# Patient Record
Sex: Male | Born: 1980 | Race: Black or African American | Hispanic: No | Marital: Single | Smoking: Current every day smoker
Health system: Southern US, Community
[De-identification: ages and names within clinical notes are randomized; demographics above are authoritative.]

## PROBLEM LIST (undated history)

## (undated) DIAGNOSIS — J45909 Unspecified asthma, uncomplicated: Secondary | ICD-10-CM

---
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
## 1998-11-24 ENCOUNTER — Emergency Department (HOSPITAL_COMMUNITY): Admission: EM | Admit: 1998-11-24 | Discharge: 1998-11-24 | Payer: Self-pay | Admitting: Emergency Medicine

## 1998-11-25 ENCOUNTER — Emergency Department (HOSPITAL_COMMUNITY): Admission: EM | Admit: 1998-11-25 | Discharge: 1998-11-25 | Payer: Self-pay | Admitting: Emergency Medicine

## 1998-11-27 ENCOUNTER — Emergency Department (HOSPITAL_COMMUNITY): Admission: EM | Admit: 1998-11-27 | Discharge: 1998-11-27 | Payer: Self-pay | Admitting: Emergency Medicine

## 1998-12-02 ENCOUNTER — Emergency Department (HOSPITAL_COMMUNITY): Admission: EM | Admit: 1998-12-02 | Discharge: 1998-12-02 | Payer: Self-pay | Admitting: Emergency Medicine

## 2003-05-18 ENCOUNTER — Emergency Department (HOSPITAL_COMMUNITY): Admission: EM | Admit: 2003-05-18 | Discharge: 2003-05-18 | Payer: Self-pay | Admitting: Emergency Medicine

## 2004-12-28 ENCOUNTER — Encounter: Admission: RE | Admit: 2004-12-28 | Discharge: 2004-12-28 | Payer: Self-pay | Admitting: Occupational Medicine

## 2005-01-19 ENCOUNTER — Ambulatory Visit (HOSPITAL_BASED_OUTPATIENT_CLINIC_OR_DEPARTMENT_OTHER): Admission: RE | Admit: 2005-01-19 | Discharge: 2005-01-19 | Payer: Self-pay | Admitting: Orthopedic Surgery

## 2005-01-19 ENCOUNTER — Ambulatory Visit (HOSPITAL_COMMUNITY): Admission: RE | Admit: 2005-01-19 | Discharge: 2005-01-19 | Payer: Self-pay | Admitting: Orthopedic Surgery

## 2007-03-21 HISTORY — PX: HAND SURGERY: SHX662

## 2009-01-15 ENCOUNTER — Emergency Department (HOSPITAL_COMMUNITY): Admission: EM | Admit: 2009-01-15 | Discharge: 2009-01-15 | Payer: Self-pay | Admitting: Family Medicine

## 2010-08-05 NOTE — Op Note (Signed)
Timothy Soto, Timothy Soto             ACCOUNT NO.:  [id number]   MEDICAL RECORD NO.:  [id number]          PATIENT TYPE:  AMB   LOCATION:  DSC                          FACILITY:  [facility]   PHYSICIAN:  Cindee Salt, M.D.       DATE OF BIRTH:  1980-04-19   DATE OF PROCEDURE:  DATE OF DISCHARGE:                                 OPERATIVE REPORT   PREOPERATIVE DIAGNOSIS:  Burn, right hand, dorsal aspect.   POSTOPERATIVE DIAGNOSIS:  Burn, right hand, dorsal aspect.   OPERATION:  Excision eschar with full-thickness skin graft from upper arm to  dorsum right hand x2.   SURGEON:  Cindee Salt, M.D.   ASSISTANT:  Carolyne Fiscal R.N.   ANESTHESIA:  General.   HISTORY:  The patient is a 30 year old male who suffered a crush injury with  a friction burn to the dorsal aspect of his right hand. He has two areas of  complete loss with significant eschar formation. He is admitted, now, for  debridement and full-thickness skin grafting.   DESCRIPTION OF PROCEDURE:  The patient is brought to the operating room.  General anesthetic carried out without difficulty.  He was prepped using  DuraPrep, supine position, right arm free. A sterile tourniquet was used.  The eschars were removed; bleeders electrocauterized. A tourniquet was then  placed and inflated after exsanguinating the limb with an Esmarch bandage.  The tourniquet was inflated to 250 mmHg.  Templates were made for the two  areas of debrided eschar. These measured approximately 1.5 x 2 cm in each  direction each.  Templates were made. A full-thickness skin graft was then  harvested from the inner upper arm. The area was undermined and closed with  interrupted 5-0 nylon sutures.   The templates had been marked onto the skin graft. These were each shaped to  the area of the defect and placed suturing them into position with a running  6-0 chromic suture. A stent dressing was placed on each. A sterile  compressive dressing and splint was applied to the  hand; a Tegaderm to the  upper arm. The patient tolerated the procedure well; and was taken to the  recovery observation in satisfactory condition. He is discharged home to  return to the [facility] [place] in 1 week on Vicodin.           ______________________________  Cindee Salt, M.D.     GK/MEDQ  D:  01/19/2005  T:  01/19/2005  Job:  [id number]

## 2010-09-01 ENCOUNTER — Emergency Department (HOSPITAL_COMMUNITY): Payer: Self-pay

## 2010-09-01 ENCOUNTER — Emergency Department (HOSPITAL_COMMUNITY)
Admission: EM | Admit: 2010-09-01 | Discharge: 2010-09-01 | Disposition: A | Discharge status: Home / Self Care | Payer: Self-pay | Attending: Emergency Medicine | Admitting: Emergency Medicine

## 2010-09-01 DIAGNOSIS — R0609 Other forms of dyspnea: Secondary | ICD-10-CM | POA: Insufficient documentation

## 2010-09-01 DIAGNOSIS — R093 Abnormal sputum: Secondary | ICD-10-CM | POA: Insufficient documentation

## 2010-09-01 DIAGNOSIS — J3489 Other specified disorders of nose and nasal sinuses: Secondary | ICD-10-CM | POA: Insufficient documentation

## 2010-09-01 DIAGNOSIS — R0602 Shortness of breath: Secondary | ICD-10-CM | POA: Insufficient documentation

## 2010-09-01 DIAGNOSIS — R05 Cough: Secondary | ICD-10-CM | POA: Insufficient documentation

## 2010-09-01 DIAGNOSIS — R059 Cough, unspecified: Secondary | ICD-10-CM | POA: Insufficient documentation

## 2010-09-01 DIAGNOSIS — F17200 Nicotine dependence, unspecified, uncomplicated: Secondary | ICD-10-CM | POA: Insufficient documentation

## 2010-09-01 DIAGNOSIS — J45909 Unspecified asthma, uncomplicated: Secondary | ICD-10-CM | POA: Insufficient documentation

## 2010-09-01 DIAGNOSIS — R0989 Other specified symptoms and signs involving the circulatory and respiratory systems: Secondary | ICD-10-CM | POA: Insufficient documentation

## 2010-09-01 DIAGNOSIS — J40 Bronchitis, not specified as acute or chronic: Secondary | ICD-10-CM | POA: Insufficient documentation

## 2015-08-04 ENCOUNTER — Emergency Department (HOSPITAL_COMMUNITY)
Admission: EM | Admit: 2015-08-04 | Discharge: 2015-08-04 | Disposition: A | Discharge status: Home / Self Care | Payer: Self-pay | Attending: Emergency Medicine | Admitting: Emergency Medicine

## 2015-08-04 ENCOUNTER — Encounter (HOSPITAL_COMMUNITY): Payer: Self-pay | Admitting: Emergency Medicine

## 2015-08-04 DIAGNOSIS — F17210 Nicotine dependence, cigarettes, uncomplicated: Secondary | ICD-10-CM | POA: Insufficient documentation

## 2015-08-04 DIAGNOSIS — M25511 Pain in right shoulder: Secondary | ICD-10-CM | POA: Insufficient documentation

## 2015-08-04 DIAGNOSIS — M542 Cervicalgia: Secondary | ICD-10-CM | POA: Insufficient documentation

## 2015-08-04 MED ORDER — NAPROXEN 250 MG PO TABS
500.0000 mg | ORAL_TABLET | Freq: Once | ORAL | Status: AC
  Administered 2015-08-04: 500 mg via ORAL
  Filled 2015-08-04: qty 2

## 2015-08-04 MED ORDER — METHOCARBAMOL 500 MG PO TABS: 500.0000 mg | ORAL_TABLET | Freq: Two times a day (BID) | ORAL | Status: DC

## 2015-08-04 MED ORDER — NAPROXEN 500 MG PO TABS: 500.0000 mg | ORAL_TABLET | Freq: Two times a day (BID) | ORAL | Status: DC

## 2015-08-04 MED ORDER — METHOCARBAMOL 500 MG PO TABS
500.0000 mg | ORAL_TABLET | Freq: Once | ORAL | Status: AC
  Administered 2015-08-04: 500 mg via ORAL
  Filled 2015-08-04: qty 1

## 2015-08-04 NOTE — ED Provider Notes (Signed)
CSN: [id number]     Arrival date & time 08/04/15  [id number] History   First MD Initiated Contact with Patient 08/04/15 0719     Chief Complaint  Patient presents with  . Neck Pain  . Shoulder Pain    HPI   Timothy Soto is an 35 y.o. male with no significant PMH who presents to the ED for evaluation of right neck and shoulder pain. He states the pain started about three weeks ago and felt like a "crook" in his neck. He states the pain has lingered and now feels like a burning pain down his right trapezius. Denies injury or trauma. Denies fever, chills, numbness, weakness, tingling. He has tried tylenol at home with no relief.   History reviewed. No pertinent past medical history. Past Surgical History  Procedure Laterality Date  . Hand surgery  2009   History reviewed. No pertinent family history. Social History  Substance Use Topics  . Smoking status: Current Every Day Smoker -- 0.50 packs/day    Types: Cigarettes  . Smokeless tobacco: Never Used  . Alcohol Use: Yes     Comment: occasionally    Review of Systems  All other systems reviewed and are negative.     Allergies  Review of patient's allergies indicates not on file.  Home Medications   Prior to Admission medications   Not on File   BP 135/96 mmHg  Pulse 81  Temp(Src) 98.4 F (36.9 C) (Oral)  Resp 18  SpO2 95% Physical Exam  Constitutional: He is oriented to person, place, and time. No distress.  HENT:  Head: Atraumatic.  Right Ear: External ear normal.  Left Ear: External ear normal.  Nose: Nose normal.  Eyes: Conjunctivae are normal. No scleral icterus.  Neck: Normal range of motion. Neck supple.  Cardiovascular: Normal rate and regular rhythm.   Pulmonary/Chest: Effort normal. No respiratory distress. He exhibits no tenderness.  Abdominal: Soft. He exhibits no distension. There is no tenderness.  Musculoskeletal:  Right trapezial tenderness and spasm. No midline back tenderness. No stepoff or  deformity. FROM of neck. 5/5 strength in bilateral upper extremities.   Neurological: He is alert and oriented to person, place, and time.  Skin: Skin is warm and dry. He is not diaphoretic.  Psychiatric: He has a normal mood and affect. His behavior is normal.  Nursing note and vitals reviewed.   ED Course  Procedures (including critical care time) Labs Review Labs Reviewed - No data to display  Imaging Review No results found. I have personally reviewed and evaluated these images and lab results as part of my medical decision-making.   EKG Interpretation None      MDM   Final diagnoses:  Right shoulder pain    Suspect msk/soft tissue strain/spasm. No midline back tenderness. No neuro deficits. Rx given for NSAIDs and muscle relaxants. Encouraged ROM exercises, warm compresses. Instructed ortho f/u if symptoms persist. ER return precautions given.     Carlene CoriaSerena Y Leona Alen, PA-C 08/04/15 0732  Tilden FossaElizabeth Rees, MD 08/08/15 [phone]

## 2015-08-04 NOTE — Discharge Instructions (Signed)
Shoulder Pain The shoulder is the joint that connects your arms to your body. The bones that form the shoulder joint include the upper arm bone (humerus), the shoulder blade (scapula), and the collarbone (clavicle). The top of the humerus is shaped like a ball and fits into a rather flat socket on the scapula (glenoid cavity). A combination of muscles and strong, fibrous tissues that connect muscles to bones (tendons) support your shoulder joint and hold the ball in the socket. Small, fluid-filled sacs (bursae) are located in different areas of the joint. They act as cushions between the bones and the overlying soft tissues and help reduce friction between the gliding tendons and the bone as you move your arm. Your shoulder joint allows a wide range of motion in your arm. This range of motion allows you to do things like scratch your back or throw a ball. However, this range of motion also makes your shoulder more prone to pain from overuse and injury. Causes of shoulder pain can originate from both injury and overuse and usually can be grouped in the following four categories:  Redness, swelling, and pain (inflammation) of the tendon (tendinitis) or the bursae (bursitis).  Instability, such as a dislocation of the joint.  Inflammation of the joint (arthritis).  Broken bone (fracture). HOME CARE INSTRUCTIONS   Apply ice to the sore area.  Put ice in a plastic bag.  Place a towel between your skin and the bag.  Leave the ice on for 15-20 minutes, 3-4 times per day for the first 2 days, or as directed by your health care provider.  Stop using cold packs if they do not help with the pain.  If you have a shoulder sling or immobilizer, wear it as long as your caregiver instructs. Only remove it to shower or bathe. Move your arm as little as possible, but keep your hand moving to prevent swelling.  Squeeze a soft ball or foam pad as much as possible to help prevent swelling.  Only take  over-the-counter or prescription medicines for pain, discomfort, or fever as directed by your caregiver. SEEK MEDICAL CARE IF:   Your shoulder pain increases, or new pain develops in your arm, hand, or fingers.  Your hand or fingers become cold and numb.  Your pain is not relieved with medicines. SEEK IMMEDIATE MEDICAL CARE IF:   Your arm, hand, or fingers are numb or tingling.  Your arm, hand, or fingers are significantly swollen or turn white or blue. MAKE SURE YOU:   Understand these instructions.  Will watch your condition.  Will get help right away if you are not doing well or get worse.   This information is not intended to replace advice given to you by your health care provider. Make sure you discuss any questions you have with your health care provider.   Document Released: 12/14/2004 Document Revised: 03/27/2014 Document Reviewed: 06/29/2014 Elsevier Interactive Patient Education 2016 Elsevier Inc.  Musculoskeletal Pain Musculoskeletal pain is muscle and boney aches and pains. These pains can occur in any part of the body. Your caregiver may treat you without knowing the cause of the pain. They may treat you if blood or urine tests, X-rays, and other tests were normal.  CAUSES There is often not a definite cause or reason for these pains. These pains may be caused by a type of germ (virus). The discomfort may also come from overuse. Overuse includes working out too hard when your body is not fit. Boney aches  also come from weather changes. Bone is sensitive to atmospheric pressure changes. HOME CARE INSTRUCTIONS   Ask when your test results will be ready. Make sure you get your test results.  Only take over-the-counter or prescription medicines for pain, discomfort, or fever as directed by your caregiver. If you were given medications for your condition, do not drive, operate machinery or power tools, or sign legal documents for 24 hours. Do not drink alcohol. Do not take  sleeping pills or other medications that may interfere with treatment.  Continue all activities unless the activities cause more pain. When the pain lessens, slowly resume normal activities. Gradually increase the intensity and duration of the activities or exercise.  During periods of severe pain, bed rest may be helpful. Lay or sit in any position that is comfortable.  Putting ice on the injured area.  Put ice in a bag.  Place a towel between your skin and the bag.  Leave the ice on for 15 to 20 minutes, 3 to 4 times a day.  Follow up with your caregiver for continued problems and no reason can be found for the pain. If the pain becomes worse or does not go away, it may be necessary to repeat tests or do additional testing. Your caregiver may need to look further for a possible cause. SEEK IMMEDIATE MEDICAL CARE IF:  You have pain that is getting worse and is not relieved by medications.  You develop chest pain that is associated with shortness or breath, sweating, feeling sick to your stomach (nauseous), or throw up (vomit).  Your pain becomes localized to the abdomen.  You develop any new symptoms that seem different or that concern you. MAKE SURE YOU:   Understand these instructions.  Will watch your condition.  Will get help right away if you are not doing well or get worse.   This information is not intended to replace advice given to you by your health care provider. Make sure you discuss any questions you have with your health care provider.   Document Released: 03/06/2005 Document Revised: 05/29/2011 Document Reviewed: 11/08/2012 Elsevier Interactive Patient Education [employer] Elsevier Inc.

## 2015-08-04 NOTE — ED Notes (Signed)
Pt to ER with complaint of right neck and shoulder pain onset x3 weeks prior to this visit. Pt denies injury to neck and shoulder. Reports waking up 3 weeks ago and feeling as if he had a "crook" in his neck. The pain has worsened. Reports it is worse with movement. Denies any other symptoms. Pt a/o x4. NAD. VSS.

## 2016-07-26 ENCOUNTER — Emergency Department (HOSPITAL_COMMUNITY)
Admission: EM | Admit: 2016-07-26 | Discharge: 2016-07-26 | Disposition: A | Discharge status: Home / Self Care | Payer: Self-pay | Attending: Emergency Medicine | Admitting: Emergency Medicine

## 2016-07-26 DIAGNOSIS — F17210 Nicotine dependence, cigarettes, uncomplicated: Secondary | ICD-10-CM | POA: Insufficient documentation

## 2016-07-26 DIAGNOSIS — L2389 Allergic contact dermatitis due to other agents: Secondary | ICD-10-CM | POA: Insufficient documentation

## 2016-07-26 MED ORDER — DIPHENHYDRAMINE-ZINC ACETATE 1-0.1 % EX CREA: TOPICAL_CREAM | Freq: Three times a day (TID) | CUTANEOUS | 0 refills | Status: DC | PRN

## 2016-07-26 NOTE — ED Triage Notes (Signed)
Pt reports being bite by something yesterday while at work. Pt has a red raised bump on left forehead.

## 2016-07-26 NOTE — Discharge Instructions (Signed)
Please read instructions below. You can apply topical Benadryl cream as needed for itching. Avoid scratching and touching your rash, as this can cause a bacterial infection. Monitor for signs of infection including pain, redness and pus draining from rash. Return to the ER for fever, signs of infection, or new or worsening symptoms.

## 2016-07-26 NOTE — ED Provider Notes (Signed)
[facility]-EMERGENCY DEPT Provider Note   CSN: [id number] Arrival date & time: 07/26/16  1255  By signing my name below, I, Marnette Burgessyan Andrew Long, attest that this documentation has been prepared under the direction and in the presence of [place] N. Russo, PA-C. Electronically Signed: Marnette Burgessyan Andrew Long, Scribe. 07/26/2016. 1:59 PM.  History   Chief Complaint Chief Complaint  Patient presents with  . Insect Bite   The history is provided by the patient and medical records. No language interpreter was used.    HPI Comments:  Timothy Soto is a 36 y.o. male with no pertinent PMHx, who presents to the Emergency Department complaining of an area of gradually worsening redness and associated swelling to the left forehead onset yesterday. Pt reports he thinks he was bitten by something yesterday at work or immediately after work on the left lateral forehead. He states the area is nonpainful but itchy. When he awoke this morning, there was yellow drainage from the site. He presents to the ED after experiencing associated symptoms of light-headedness, fever, and a HA during work today. No alleviating factors noted. No h/o herpes, eczema, or psoriasis. No new soaps, lotions, detergents, foods, animals, plants, or medications used. Pt denies tongue swelling, difficulty swallowing, SOB, dizziness, and any other complaints at this time.   No past medical history on file.  There are no active problems to display for this patient.  Past Surgical History:  Procedure Laterality Date  . HAND SURGERY  2009    Home Medications    Prior to Admission medications   Medication Sig Start Date End Date Taking? Authorizing Provider  methocarbamol (ROBAXIN) 500 MG tablet Take 1 tablet (500 mg total) by mouth 2 (two) times daily. 08/04/15   Sam, Ace GinsSerena Y, PA-C  naproxen (NAPROSYN) 500 MG tablet Take 1 tablet (500 mg total) by mouth 2 (two) times daily. 08/04/15   Carlene CoriaSam, Serena Y, PA-C   Family History No family history on  file.  Social History Social History  Substance Use Topics  . Smoking status: Current Every Day Smoker    Packs/day: 0.50    Types: Cigarettes  . Smokeless tobacco: Never Used  . Alcohol use Yes     Comment: occasionally   Allergies   Patient has no known allergies.  Review of Systems Review of Systems  HENT: Positive for facial swelling (around the "bite"). Negative for trouble swallowing.   Respiratory: Negative for shortness of breath.   Neurological: Positive for light-headedness. Negative for dizziness.    Physical Exam Updated Vital Signs BP (!) 127/94 (BP Location: Right Arm)   Pulse 96   Temp 98.5 F (36.9 C) (Oral)   Resp 18   Ht 5\' 9"  (1.753 m)   Wt 90.7 kg   SpO2 97%   BMI 29.53 kg/m   Physical Exam  Constitutional: He appears well-developed and well-nourished. No distress.  HENT:  Head: Normocephalic and atraumatic.  Eyes: Conjunctivae and EOM are normal. Pupils are equal, round, and reactive to light.  Cardiovascular: Normal rate.   Pulmonary/Chest: Effort normal.  Skin:  Left temple with 1 cm round vesicular plaque w minimal underlying edema, nontender, no purulent drainage, not fluctuant.   Psychiatric: He has a normal mood and affect. His behavior is normal.  Nursing note and vitals reviewed.  ED Treatments / Results  DIAGNOSTIC STUDIES:  Oxygen Saturation is 97% on RA, normal by my interpretation.    COORDINATION OF CARE:  1:56 PM Discussed treatment plan with pt at  bedside and pt agreed to plan.  Labs (all labs ordered are listed, but only abnormal results are displayed) Labs Reviewed - No data to display  EKG  EKG Interpretation None       Radiology No results found.  Procedures Procedures (including critical care time)  Medications Ordered in ED Medications - No data to display   Initial Impression / Assessment and Plan / ED Course  I have reviewed the triage vital signs and the nursing notes.  Pertinent labs &  imaging results that were available during my care of the patient were reviewed by me and considered in my medical decision making (see chart for details).    Patient with contact dermatitis. Instructed to avoid itching and touching rash, as well as to offending agents and to use unscented soaps, lotions, and detergents. Will treat with topical benadryl.  No signs of secondary infection. Return precautions discussed. Pt is safe for discharge at this time.  Patient discussed with Dr. Jeraldine Loots.  Discussed results, findings, treatment and follow up. Patient advised of return precautions. Patient verbalized understanding and agreed with plan.   Final Clinical Impressions(s) / ED Diagnoses   Final diagnoses:  Allergic contact dermatitis due to other agents    New Prescriptions New Prescriptions   No medications on file   I personally performed the services described in this documentation, which was scribed in my presence. The recorded information has been reviewed and is accurate.\   Russo, [place] N, PA-C 07/26/16 1412    Gerhard Munch, MD 07/30/16 [phone]

## 2016-09-03 ENCOUNTER — Emergency Department (HOSPITAL_COMMUNITY)
Admission: EM | Admit: 2016-09-03 | Discharge: 2016-09-03 | Disposition: A | Discharge status: Home / Self Care | Payer: Self-pay | Attending: Emergency Medicine | Admitting: Emergency Medicine

## 2016-09-03 ENCOUNTER — Encounter (HOSPITAL_COMMUNITY): Payer: Self-pay | Admitting: Emergency Medicine

## 2016-09-03 ENCOUNTER — Emergency Department (HOSPITAL_COMMUNITY): Payer: Self-pay

## 2016-09-03 DIAGNOSIS — Y929 Unspecified place or not applicable: Secondary | ICD-10-CM | POA: Insufficient documentation

## 2016-09-03 DIAGNOSIS — Z79899 Other long term (current) drug therapy: Secondary | ICD-10-CM | POA: Insufficient documentation

## 2016-09-03 DIAGNOSIS — S62339A Displaced fracture of neck of unspecified metacarpal bone, initial encounter for closed fracture: Secondary | ICD-10-CM

## 2016-09-03 DIAGNOSIS — Y9389 Activity, other specified: Secondary | ICD-10-CM | POA: Insufficient documentation

## 2016-09-03 DIAGNOSIS — F17210 Nicotine dependence, cigarettes, uncomplicated: Secondary | ICD-10-CM | POA: Insufficient documentation

## 2016-09-03 DIAGNOSIS — M79641 Pain in right hand: Secondary | ICD-10-CM

## 2016-09-03 DIAGNOSIS — Z791 Long term (current) use of non-steroidal anti-inflammatories (NSAID): Secondary | ICD-10-CM | POA: Insufficient documentation

## 2016-09-03 DIAGNOSIS — S62336A Displaced fracture of neck of fifth metacarpal bone, right hand, initial encounter for closed fracture: Secondary | ICD-10-CM | POA: Insufficient documentation

## 2016-09-03 DIAGNOSIS — Y999 Unspecified external cause status: Secondary | ICD-10-CM | POA: Insufficient documentation

## 2016-09-03 NOTE — ED Triage Notes (Signed)
Pt to ER for eval of right hand swelling and pain after injury Friday night. VSS. Swelling noted to right hand.

## 2016-09-03 NOTE — Progress Notes (Signed)
Orthopedic Tech Progress Note Patient Details:  Timothy Soto [birth year] [id number]  Ortho Devices Type of Ortho Device: Arm sling, Ulna gutter splint Ortho Device/Splint Location: rue Ortho Device/Splint Interventions: Ordered, Application, Adjustment   Timothy Soto, Timothy Soto 09/03/2016, 8:01 PM

## 2016-09-03 NOTE — Discharge Instructions (Signed)
Please take Ibuprofen (Advil, motrin) and Tylenol (acetaminophen) to relieve your pain.  You may take up to 800 MG (4 pills) of normal strength ibuprofen every 8 hours as needed.  In between doses of ibuprofen you make take tylenol, up to 1,000 mg (two extra strength pills).  Do not take more than 3,000 mg tylenol in a 24 hour period.  Please check all medication labels as many medications such as pain and cold medications may contain tylenol.  Do not drink alcohol while taking these medications.  Do not take other NSAID'S while taking ibuprofen (such as aleve or naproxen). Please take ibuprofen with food to decrease stomach upset.  Your prescription pain medication contains Tylenol. Please only take the prescription pain medication if your pain is unable to be controlled with ibuprofen and Tylenol.
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
## 2017-07-16 ENCOUNTER — Encounter (HOSPITAL_COMMUNITY): Payer: Self-pay | Admitting: Emergency Medicine

## 2017-07-16 ENCOUNTER — Emergency Department (HOSPITAL_COMMUNITY): Payer: Self-pay

## 2017-07-16 ENCOUNTER — Emergency Department (HOSPITAL_COMMUNITY)
Admission: EM | Admit: 2017-07-16 | Discharge: 2017-07-16 | Disposition: A | Discharge status: Home / Self Care | Payer: Self-pay | Attending: Emergency Medicine | Admitting: Emergency Medicine

## 2017-07-16 DIAGNOSIS — Y999 Unspecified external cause status: Secondary | ICD-10-CM | POA: Insufficient documentation

## 2017-07-16 DIAGNOSIS — S86911A Strain of unspecified muscle(s) and tendon(s) at lower leg level, right leg, initial encounter: Secondary | ICD-10-CM | POA: Insufficient documentation

## 2017-07-16 DIAGNOSIS — Z79899 Other long term (current) drug therapy: Secondary | ICD-10-CM | POA: Insufficient documentation

## 2017-07-16 DIAGNOSIS — X501XXA Overexertion from prolonged static or awkward postures, initial encounter: Secondary | ICD-10-CM | POA: Insufficient documentation

## 2017-07-16 DIAGNOSIS — S86111A Strain of other muscle(s) and tendon(s) of posterior muscle group at lower leg level, right leg, initial encounter: Secondary | ICD-10-CM

## 2017-07-16 DIAGNOSIS — Y9367 Activity, basketball: Secondary | ICD-10-CM | POA: Insufficient documentation

## 2017-07-16 DIAGNOSIS — F17210 Nicotine dependence, cigarettes, uncomplicated: Secondary | ICD-10-CM | POA: Insufficient documentation

## 2017-07-16 DIAGNOSIS — Y929 Unspecified place or not applicable: Secondary | ICD-10-CM | POA: Insufficient documentation

## 2017-07-16 MED ORDER — IBUPROFEN 400 MG PO TABS
600.0000 mg | ORAL_TABLET | Freq: Once | ORAL | Status: AC
Start: 2017-07-16 — End: 2017-07-16
  Administered 2017-07-16: 600 mg via ORAL
  Filled 2017-07-16: qty 1

## 2017-07-16 NOTE — ED Triage Notes (Signed)
Pt reports playing basketball yesterday, landed wrong and heard something pop in right calf, has had pain since then.

## 2017-07-16 NOTE — ED Notes (Signed)
Patient transported to X-ray 

## 2017-07-16 NOTE — ED Provider Notes (Signed)
[facility] EMERGENCY DEPARTMENT Provider Note   CSN: [id number] Arrival date & time: 07/16/17  4098     History   Chief Complaint Chief Complaint  Patient presents with  . Leg Pain    HPI Timothy Soto is a 37 y.o. male.  HPI   Timothy Soto is a 37 year old male with no significant past medical history who presents to the emergency department for evaluation of right calf pain.  Patient reports that he was playing basketball yesterday evening with his son.  He went in for a lab and when he landed he felt a popping sensation in his right calf.  Reports pain ever since.  States that pain is sharp and located in the mid right calf.  Pain is only present with walking.  It is particularly worsened with ankle plantarflexion.  Reports he has no pain at rest.  He has not taken any over-the-counter medications for his symptoms.  Denies break in skin, ecchymosis, swelling in the leg, knee pain, ankle pain, numbness, weakness, fever, chills.  Is able to ambulate independently, although painful.  History reviewed. No pertinent past medical history.  There are no active problems to display for this patient.   Past Surgical History:  Procedure Laterality Date  . HAND SURGERY  2009        Home Medications    Prior to Admission medications   Medication Sig Start Date End Date Taking? Authorizing Provider  acetaminophen (TYLENOL) 500 MG tablet Take 1,000 mg by mouth every 6 (six) hours as needed for moderate pain.   Yes [provider]  diphenhydrAMINE-zinc acetate (BENADRYL) cream Apply topically 3 (three) times daily as needed for itching. Patient not taking: Reported on 07/16/2017 07/26/16   Robinson, [place] N, PA-C  methocarbamol (ROBAXIN) 500 MG tablet Take 1 tablet (500 mg total) by mouth 2 (two) times daily. Patient not taking: Reported on 07/16/2017 08/04/15   Sam, Ace Gins, PA-C  naproxen (NAPROSYN) 500 MG tablet Take 1 tablet (500 mg total) by mouth 2  (two) times daily. Patient not taking: Reported on 07/16/2017 08/04/15   Carlene Coria, PA-C    Family History No family history on file.  Social History Social History   Tobacco Use  . Smoking status: Current Every Day Smoker    Packs/day: 0.50    Types: Cigarettes  . Smokeless tobacco: Never Used  Substance Use Topics  . Alcohol use: Yes    Comment: occasionally  . Drug use: Yes    Types: Marijuana    Comment: occasionally     Allergies   Patient has no known allergies.   Review of Systems Review of Systems  Constitutional: Negative for chills and fever.  Musculoskeletal: Positive for myalgias (right calf). Negative for gait problem and joint swelling.  Skin: Negative for color change, rash and wound.  Neurological: Negative for weakness and numbness.     Physical Exam Updated Vital Signs BP 96/70   Pulse 74   Temp 98.5 F (36.9 C) (Oral)   Resp 20   SpO2 99%   Physical Exam  Constitutional: He is oriented to person, place, and time. He appears well-developed and well-nourished. No distress.  HENT:  Head: Normocephalic and atraumatic.  Eyes: Right eye exhibits no discharge. Left eye exhibits no discharge.  Pulmonary/Chest: Effort normal. No respiratory distress.  Musculoskeletal:       Legs: Tender to palpation over right calf as depicted in image. No overlying rash, bruising or break in  skin. No appreciable swelling. No tenderness over the knee or ankle. Full ROM of knee joint and ankle joint. Achilles intact. DP pulses 2+ bilaterally. Distal sensation to light touch intact in bilateral LE.   Neurological: He is alert and oriented to person, place, and time. Coordination normal.  Skin: Skin is warm and dry. Capillary refill takes less than 2 seconds. He is not diaphoretic.  Psychiatric: He has a normal mood and affect. His behavior is normal.  Nursing note and vitals reviewed.    ED Treatments / Results  Labs (all labs ordered are listed, but only  abnormal results are displayed) Labs Reviewed - No data to display  EKG None  Radiology Dg Tibia/fibula Right  Result Date: 07/16/2017 CLINICAL DATA:  Pain and swelling.  Injury while playing basketball EXAM: RIGHT TIBIA AND FIBULA - 2 VIEW COMPARISON:  None. FINDINGS: Frontal and lateral views were obtained. There is no fracture or dislocation. Joint spaces appear normal. No knee or ankle joint effusion. There is a small inferior calcaneal spur. IMPRESSION: No fracture or dislocation. Joint spaces appear unremarkable. Small inferior calcaneal spur. Electronically Signed   By: Bretta Bang III M.D.   On: 07/16/2017 13:03    Procedures Procedures (including critical care time)  Medications Ordered in ED Medications  ibuprofen (ADVIL,MOTRIN) tablet 600 mg (600 mg Oral Given 07/16/17 1315)     Initial Impression / Assessment and Plan / ED Course  I have reviewed the triage vital signs and the nursing notes.  Pertinent labs & imaging results that were available during my care of the patient were reviewed by me and considered in my medical decision making (see chart for details).    X-ray right tib/fib without acute abnormality.  RLE neurovascularly intact.   Presentation consistent with gastrocnemius strain. Have counseled him on NSAID use and RICE protocol. Discussed reasons to return to the Emergency Department and he voices understanding.   Final Clinical Impressions(s) / ED Diagnoses   Final diagnoses:  Strain of right gastrocnemius muscle, initial encounter    ED Discharge Orders    None       Lawrence Marseilles 07/16/17 1343    Tegeler, Canary Brim, MD 07/16/17 [phone]

## 2017-07-16 NOTE — Discharge Instructions (Addendum)
X-ray reassuring.  No broken bones.  Please take 600 mg ibuprofen every 6 hours for pain.  Continue to ice at least twice a day for 15 minutes at a time.  Please elevate the leg when you can.  Return to the ER if you have any new or worsening symptoms like numbness or you notice worsening swelling.

## 2018-02-17 ENCOUNTER — Emergency Department (HOSPITAL_COMMUNITY)
Admission: EM | Admit: 2018-02-17 | Discharge: 2018-02-17 | Disposition: A | Discharge status: Home / Self Care | Payer: Self-pay | Attending: Emergency Medicine | Admitting: Emergency Medicine

## 2018-02-17 ENCOUNTER — Emergency Department (HOSPITAL_COMMUNITY): Payer: Self-pay

## 2018-02-17 ENCOUNTER — Encounter (HOSPITAL_COMMUNITY): Payer: Self-pay | Admitting: Emergency Medicine

## 2018-02-17 DIAGNOSIS — F1290 Cannabis use, unspecified, uncomplicated: Secondary | ICD-10-CM | POA: Insufficient documentation

## 2018-02-17 DIAGNOSIS — L309 Dermatitis, unspecified: Secondary | ICD-10-CM | POA: Insufficient documentation

## 2018-02-17 DIAGNOSIS — F17210 Nicotine dependence, cigarettes, uncomplicated: Secondary | ICD-10-CM | POA: Insufficient documentation

## 2018-02-17 DIAGNOSIS — L03011 Cellulitis of right finger: Secondary | ICD-10-CM | POA: Insufficient documentation

## 2018-02-17 MED ORDER — LIDOCAINE HCL 2 % IJ SOLN
20.0000 mL | Freq: Once | INTRAMUSCULAR | Status: AC
  Administered 2018-02-17: 400 mg via INTRADERMAL
  Filled 2018-02-17: qty 20

## 2018-02-17 MED ORDER — TRIAMCINOLONE ACETONIDE 0.5 % EX OINT: 1.0000 "application " | TOPICAL_OINTMENT | Freq: Two times a day (BID) | CUTANEOUS | 0 refills | Status: DC

## 2018-02-17 NOTE — ED Triage Notes (Signed)
Pt states he slammed his right ring finger in a door last Monday. Swelling and bruising noted. Still has sensation to finger, able to move without difficulty.

## 2018-02-17 NOTE — ED Notes (Signed)
Patient able to ambulate independently  

## 2018-02-17 NOTE — ED Provider Notes (Signed)
[facility]   CSN: [id number] Arrival date & time: 02/17/18  1019     History   Chief Complaint Chief Complaint  Patient presents with  . Finger Injury    HPI Timothy Soto is a 37 y.o. male who presents with right ring finger pain. No significant PMH. He states that one week ago he slammed his finger in the car door. Since then is has progressively become more swollen. He states it was bruised looking last night and he iced it and it improved. The pain is mild-moderate. He denies any drainage from the area. 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 He also bites his nails at times.  HPI  History reviewed. No pertinent past medical history.  There are no active problems to display for this patient.   Past Surgical History:  Procedure Laterality Date  . HAND SURGERY  2009        Home Medications    Prior to Admission medications   Medication Sig Start Date End Date Taking? Authorizing Provider  acetaminophen (TYLENOL) 500 MG tablet Take 1,000 mg by mouth every 6 (six) hours as needed for moderate pain.    [provider]  diphenhydrAMINE-zinc acetate (BENADRYL) cream Apply topically 3 (three) times daily as needed for itching. Patient not taking: Reported on 07/16/2017 07/26/16   Robinson, [place] N, PA-C  methocarbamol (ROBAXIN) 500 MG tablet Take 1 tablet (500 mg total) by mouth 2 (two) times daily. Patient not taking: Reported on 07/16/2017 08/04/15   Sam, Ace GinsSerena Y, PA-C  naproxen (NAPROSYN) 500 MG tablet Take 1 tablet (500 mg total) by mouth 2 (two) times daily. Patient not taking: Reported on 07/16/2017 08/04/15   Carlene CoriaSam, Serena Y, PA-C    Family History No family history on file.  Social History Social History   Tobacco Use  . Smoking status: Current Every Day Smoker    Packs/day: 0.50    Types: Cigarettes  . Smokeless  tobacco: Never Used  Substance Use Topics  . Alcohol use: Yes    Comment: occasionally  . Drug use: Yes    Types: Marijuana    Comment: occasionally     Allergies   Shellfish allergy   Review of Systems Review of Systems  Musculoskeletal: Positive for arthralgias and joint swelling.  Neurological: Negative for weakness and numbness.     Physical Exam Updated Vital Signs BP 133/82   Pulse 77   Temp 98.2 F (36.8 C)   Resp 19   SpO2 99%   Physical Exam  Constitutional: He is oriented to person, place, and time. He appears well-developed and well-nourished. No distress.  HENT:  Head: Normocephalic and atraumatic.  Eyes: Pupils are equal, round, and reactive to light. Conjunctivae are normal. Right eye exhibits no discharge. Left eye exhibits no discharge. No scleral icterus.  Neck: Normal range of motion.  Cardiovascular: Normal rate.  Pulmonary/Chest: Effort normal. No respiratory distress.  Abdominal: He exhibits no distension.  Musculoskeletal:  Right ring finger: Swelling over distal tip of ring finger with paronychia over lateal nail fold. FROM of DIP and PIP joint. Dry, scaly, erythematous papular rash in between finger web spaces and dorsal aspect of hand  Neurological: He is alert and oriented to person, place, and time.  Skin: Skin is warm and dry.  Psychiatric: He has a normal mood and affect. His behavior  is normal.  Nursing note and vitals reviewed.    ED Treatments / Results  Labs (all labs ordered are listed, but only abnormal results are displayed) Labs Reviewed - No data to display  EKG None  Radiology Dg Finger Ring Right  Result Date: 02/17/2018 CLINICAL DATA:  Recent injury to the distal right fourth finger with swelling and bruising and pain EXAM: RIGHT RING FINGER 2+V COMPARISON:  None. FINDINGS: Soft tissue swelling in the distal right fourth finger. No fracture or dislocation. No suspicious focal osseous lesion. No significant  arthropathy. No osseous erosions. No radiopaque foreign body. IMPRESSION: Distal right fourth finger soft tissue swelling, with no fracture or malalignment. No specific radiographic findings of osteomyelitis. Electronically Signed   By: Delbert Phenix M.D.   On: 02/17/2018 11:37    Procedures .Marland KitchenIncision and Drainage Date/Time: 02/17/2018 12:17 PM Performed by: Bethel Born, PA-C Authorized by: Bethel Born, PA-C   Consent:    Consent obtained:  Verbal   Consent given by:  Patient   Risks discussed:  Bleeding, incomplete drainage, pain and damage to other organs   Alternatives discussed:  No treatment Universal protocol:    Procedure explained and questions answered to patient or proxy's satisfaction: yes     Relevant documents present and verified: yes     Test results available and properly labeled: yes     Imaging studies available: yes     Required blood products, implants, devices, and special equipment available: yes     Site/side marked: yes     Immediately prior to procedure a time out was called: yes     Patient identity confirmed:  Verbally with patient Location:    Type:  Abscess (paronychia)   Location:  Upper extremity   Upper extremity location:  Finger   Finger location:  R ring finger Pre-procedure details:    Skin preparation:  Chloraprep Anesthesia (see MAR for exact dosages):    Anesthesia method:  Nerve block   Block needle gauge:  25 G   Block anesthetic:  Lidocaine 2% w/o epi   Block technique:  Digital   Block injection procedure:  Anatomic landmarks identified, anatomic landmarks palpated, introduced needle, negative aspiration for blood and incremental injection   Block outcome:  Anesthesia achieved Procedure type:    Complexity:  Simple Procedure details:    Incision types:  Single straight   Incision depth:  Subcutaneous   Scalpel blade:  11   Wound management:  Probed and deloculated, irrigated with saline and extensive cleaning    Drainage:  Purulent   Drainage amount:  Moderate   Packing materials:  None Post-procedure details:    Patient tolerance of procedure:  Tolerated well, no immediate complications   (including critical care time)    Medications Ordered in ED Medications  lidocaine (XYLOCAINE) 2 % (with pres) injection 400 mg (has no administration in time range)     Initial Impression / Assessment and Plan / ED Course  I have reviewed the triage vital signs and the nursing notes.  Pertinent labs & imaging results that were available during my care of the patient were reviewed by me and considered in my medical decision making (see chart for details).  37 year old male presents with right ring finger swelling. He states he shut it in a car door however exam is consistent with a paronychia. Xray is negative for fracture. Digital block was performed and anesthesia was achieved. I&D was performed and purulent drainage was  expressed. The finger was cleaned and dressed and he was given wound care instructions. He is also asking for assistance with a rash on his hands. It appears consistent with dishydrotic eczema. He will be given triamcinolone cream and advised to f/u with dermatology if not improving.  Final Clinical Impressions(s) / ED Diagnoses   Final diagnoses:  Paronychia of finger of right hand  Eczema, unspecified type    ED Discharge Orders    None       Bethel Born, PA-C 02/17/18 1220    Gwyneth Sprout, MD 02/17/18 2056

## 2018-02-17 NOTE — Discharge Instructions (Signed)
For the finger: Please keep clean and dry. Return if it is not improving  For the rash on the hand: Protect your hand from chemicals but do not wear gloves for long periods of time. Use triamcinolone ointment on the hands 1-2 times a day. Follow up with a dermatologist if you are not improving

## 2018-05-01 ENCOUNTER — Emergency Department (HOSPITAL_COMMUNITY)
Admission: EM | Admit: 2018-05-01 | Discharge: 2018-05-01 | Disposition: A | Discharge status: Home / Self Care | Payer: Self-pay | Attending: Emergency Medicine | Admitting: Emergency Medicine

## 2018-05-01 ENCOUNTER — Emergency Department (HOSPITAL_COMMUNITY): Payer: Self-pay

## 2018-05-01 ENCOUNTER — Other Ambulatory Visit: Payer: Self-pay

## 2018-05-01 DIAGNOSIS — Z79899 Other long term (current) drug therapy: Secondary | ICD-10-CM | POA: Insufficient documentation

## 2018-05-01 DIAGNOSIS — R69 Illness, unspecified: Secondary | ICD-10-CM

## 2018-05-01 DIAGNOSIS — F17210 Nicotine dependence, cigarettes, uncomplicated: Secondary | ICD-10-CM | POA: Insufficient documentation

## 2018-05-01 DIAGNOSIS — J111 Influenza due to unidentified influenza virus with other respiratory manifestations: Secondary | ICD-10-CM

## 2018-05-01 DIAGNOSIS — J101 Influenza due to other identified influenza virus with other respiratory manifestations: Secondary | ICD-10-CM | POA: Insufficient documentation

## 2018-05-01 MED ORDER — NAPROXEN 500 MG PO TABS: 500.0000 mg | ORAL_TABLET | Freq: Two times a day (BID) | ORAL | 0 refills | Status: AC

## 2018-05-01 MED ORDER — BENZONATATE 100 MG PO CAPS: 100.0000 mg | ORAL_CAPSULE | Freq: Three times a day (TID) | ORAL | 0 refills | Status: DC | PRN

## 2018-05-01 MED ORDER — ALBUTEROL SULFATE HFA 108 (90 BASE) MCG/ACT IN AERS
1.0000 | INHALATION_SPRAY | Freq: Four times a day (QID) | RESPIRATORY_TRACT | Status: DC | PRN
  Administered 2018-05-01: 2 via RESPIRATORY_TRACT
  Filled 2018-05-01: qty 6.7

## 2018-05-01 NOTE — ED Notes (Signed)
Pt agreed to go home and take tylenol and ibuprofen.

## 2018-05-01 NOTE — ED Notes (Signed)
Patient transported to X-ray 

## 2018-05-01 NOTE — Discharge Instructions (Signed)
Take Naprosyn and Tessalon as needed to help with cough and the body aches.  Drink plenty fluids, rest.  Follow-up with your primary care doctor if not better in a week or so

## 2018-05-01 NOTE — ED Triage Notes (Signed)
Pt here for headache, malaise, fever, and chills onset last night. Last Tylenol 0700 this morning.

## 2018-05-01 NOTE — ED Provider Notes (Signed)
[facility]   CSN: [id number] Arrival date & time: 05/01/18  1115     History   Chief Complaint Chief Complaint  Patient presents with  . Fever  . Headache    HPI Timothy Soto is a 38 y.o. male.  HPI Pt thought he had a fever last night.  He did not have a thermometer to confirm.  He developed a headache, chills, myalgias  and felt weak.  He started aspirin.  Not much of a cough.  No vomiting or diarrhea.  No sore throat.  No dysuria.  No abdominal pain.  He continues to have body aches.  He feels lousy and was supposed to work today. No past medical history on file.  There are no active problems to display for this patient.   Past Surgical History:  Procedure Laterality Date  . HAND SURGERY  2009        Home Medications    Prior to Admission medications   Medication Sig Start Date End Date Taking? Authorizing Provider  acetaminophen (TYLENOL) 500 MG tablet Take 1,000 mg by mouth every 6 (six) hours as needed for moderate pain.    [provider]  benzonatate (TESSALON) 100 MG capsule Take 1 capsule (100 mg total) by mouth 3 (three) times daily as needed for cough. 05/01/18   Linwood DibblesKnapp, Aileen Amore, MD  naproxen (NAPROSYN) 500 MG tablet Take 1 tablet (500 mg total) by mouth 2 (two) times daily for 7 days. 05/01/18 05/08/18  Linwood DibblesKnapp, Joliana Claflin, MD  triamcinolone ointment (KENALOG) 0.5 % Apply 1 application topically 2 (two) times daily. 02/17/18   Bethel BornGekas, Kelly Marie, PA-C    Family History No family history on file.  Social History Social History   Tobacco Use  . Smoking status: Current Every Day Smoker    Packs/day: 0.50    Types: Cigarettes  . Smokeless tobacco: Never Used  Substance Use Topics  . Alcohol use: Yes    Comment: occasionally  . Drug use: Yes    Types: Marijuana    Comment: occasionally     Allergies   Shellfish allergy   Review of Systems Review of Systems  All other systems reviewed and  are negative.    Physical Exam Updated Vital Signs BP (!) 130/102 (BP Location: Right Arm)   Pulse (!) 102   Temp 99.3 F (37.4 C) (Oral)   Resp 17   SpO2 100%   Physical Exam Vitals signs and nursing note reviewed.  Constitutional:      General: He is not in acute distress.    Appearance: He is well-developed.  HENT:     Head: Normocephalic and atraumatic.     Right Ear: External ear normal.     Left Ear: External ear normal.  Eyes:     General: No scleral icterus.       Right eye: No discharge.        Left eye: No discharge.     Conjunctiva/sclera: Conjunctivae normal.  Neck:     Musculoskeletal: Neck supple.     Trachea: No tracheal deviation.  Cardiovascular:     Rate and Rhythm: Normal rate and regular rhythm.  Pulmonary:     Effort: Pulmonary effort is normal. No respiratory distress.     Breath sounds: No stridor. Wheezing present. No rales.  Abdominal:     General: Bowel sounds are normal. There is no distension.     Palpations: Abdomen is soft.  Tenderness: There is no abdominal tenderness. There is no guarding or rebound.  Musculoskeletal:        General: No tenderness.  Skin:    General: Skin is warm and dry.     Findings: No rash.  Neurological:     Mental Status: He is alert.     Cranial Nerves: No cranial nerve deficit (no facial droop, extraocular movements intact, no slurred speech).     Sensory: No sensory deficit.     Motor: No abnormal muscle tone or seizure activity.     Coordination: Coordination normal.      ED Treatments / Results  Labs (all labs ordered are listed, but only abnormal results are displayed) Labs Reviewed - No data to display  EKG None  Radiology Dg Chest 2 View  Result Date: 05/01/2018 CLINICAL DATA:  Fever for 1 day.  Denies cough. EXAM: CHEST - 2 VIEW COMPARISON:  09/01/2010. FINDINGS: The heart size and mediastinal contours are within normal limits. Both lungs are clear. The visualized skeletal structures  are unremarkable. IMPRESSION: No active cardiopulmonary disease. Electronically Signed   By: Elsie StainJohn T Curnes M.D.   On: 05/01/2018 12:56    Procedures Procedures (including critical care time)  Medications Ordered in ED Medications  albuterol (PROVENTIL HFA;VENTOLIN HFA) 108 (90 Base) MCG/ACT inhaler 1-2 puff (2 puffs Inhalation Given 05/01/18 1202)     Initial Impression / Assessment and Plan / ED Course  I have reviewed the triage vital signs and the nursing notes.  Pertinent labs & imaging results that were available during my care of the patient were reviewed by me and considered in my medical decision making (see chart for details).   Chest x-ray without pneumonia.  He did have some mild wheezing on exam.  Patient was given albuterol inhaler.  Suspect symptoms are related to an influenza-like illness.  Discussed supportive care.  Follow-up if not improving in the next week.  Return as needed for worsening symptoms  Final Clinical Impressions(s) / ED Diagnoses   Final diagnoses:  Influenza-like illness    ED Discharge Orders         Ordered    naproxen (NAPROSYN) 500 MG tablet  2 times daily     05/01/18 1317    benzonatate (TESSALON) 100 MG capsule  3 times daily PRN     05/01/18 1317           Linwood DibblesKnapp, Ariannie Penaloza, MD 05/01/18 1319

## 2019-04-05 IMAGING — DX DG TIBIA/FIBULA 2V*R*
4 series · 4 of 4 positions shown · non-contrast
Comparison: None.

CLINICAL DATA: Pain and swelling.  Injury while playing basketball

EXAM:
RIGHT TIBIA AND FIBULA - 2 VIEW

[tibia ap (1 of 2)]
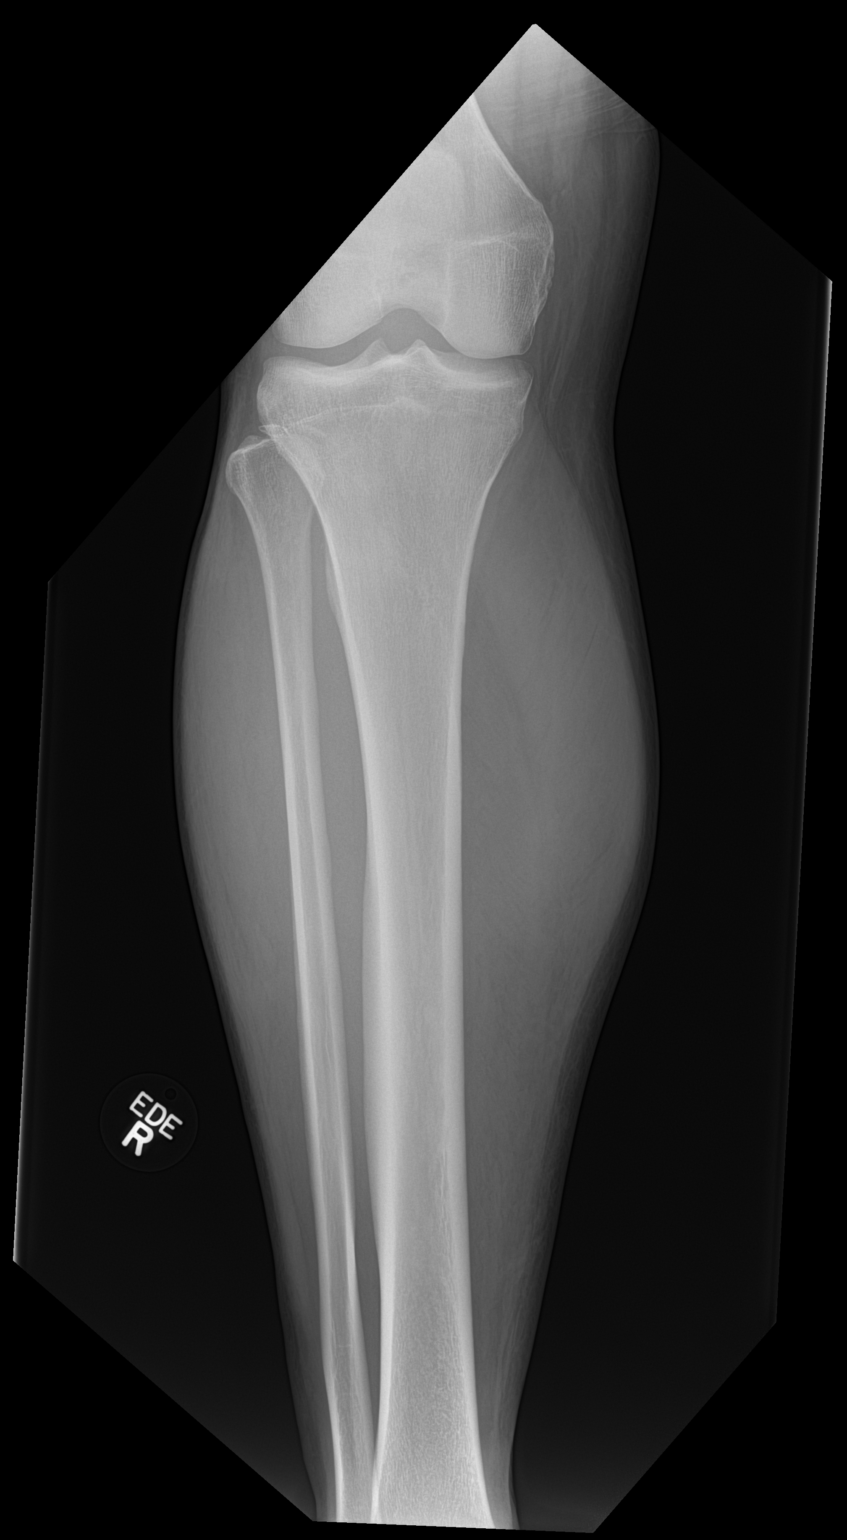

[tibia ap (2 of 2)]
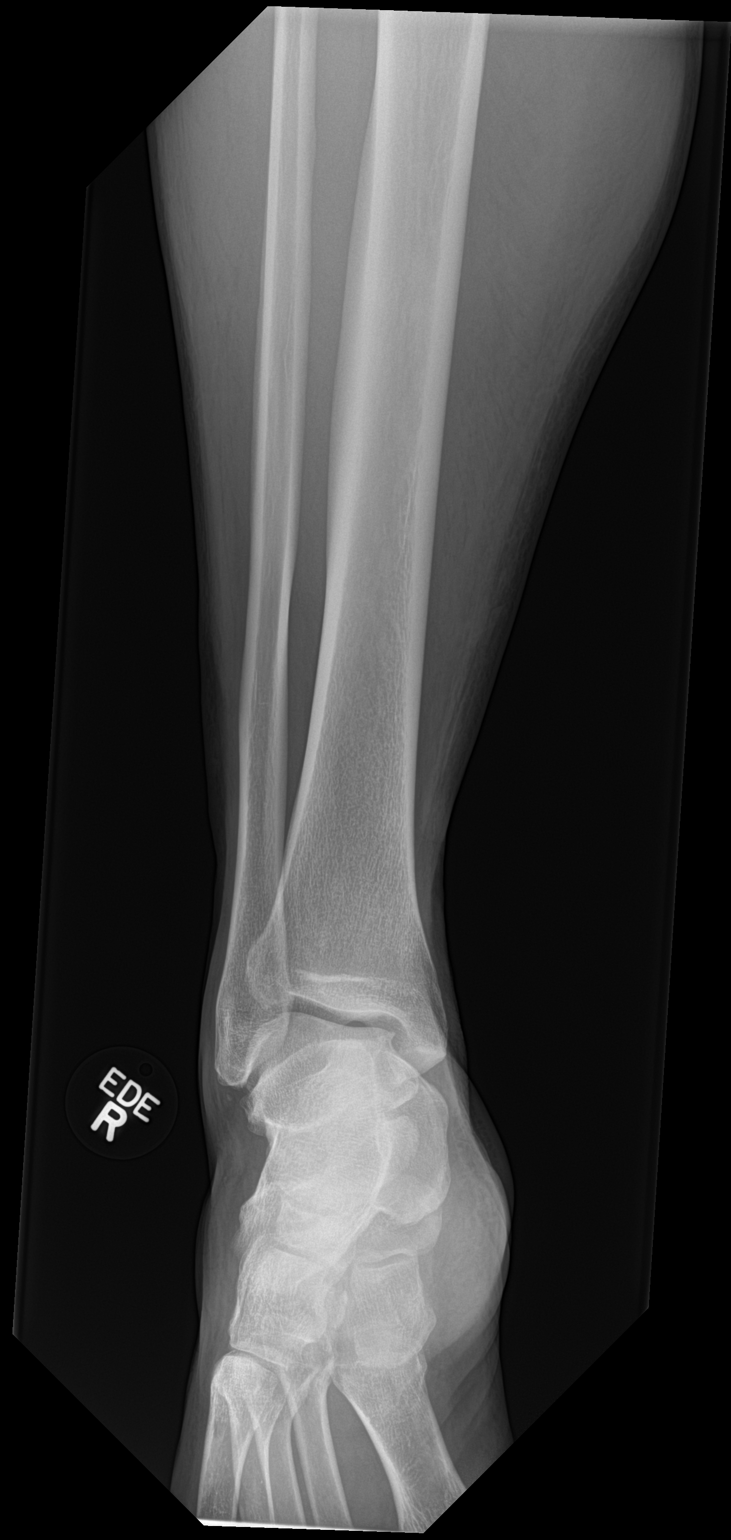

[tibia lat (1 of 2)]
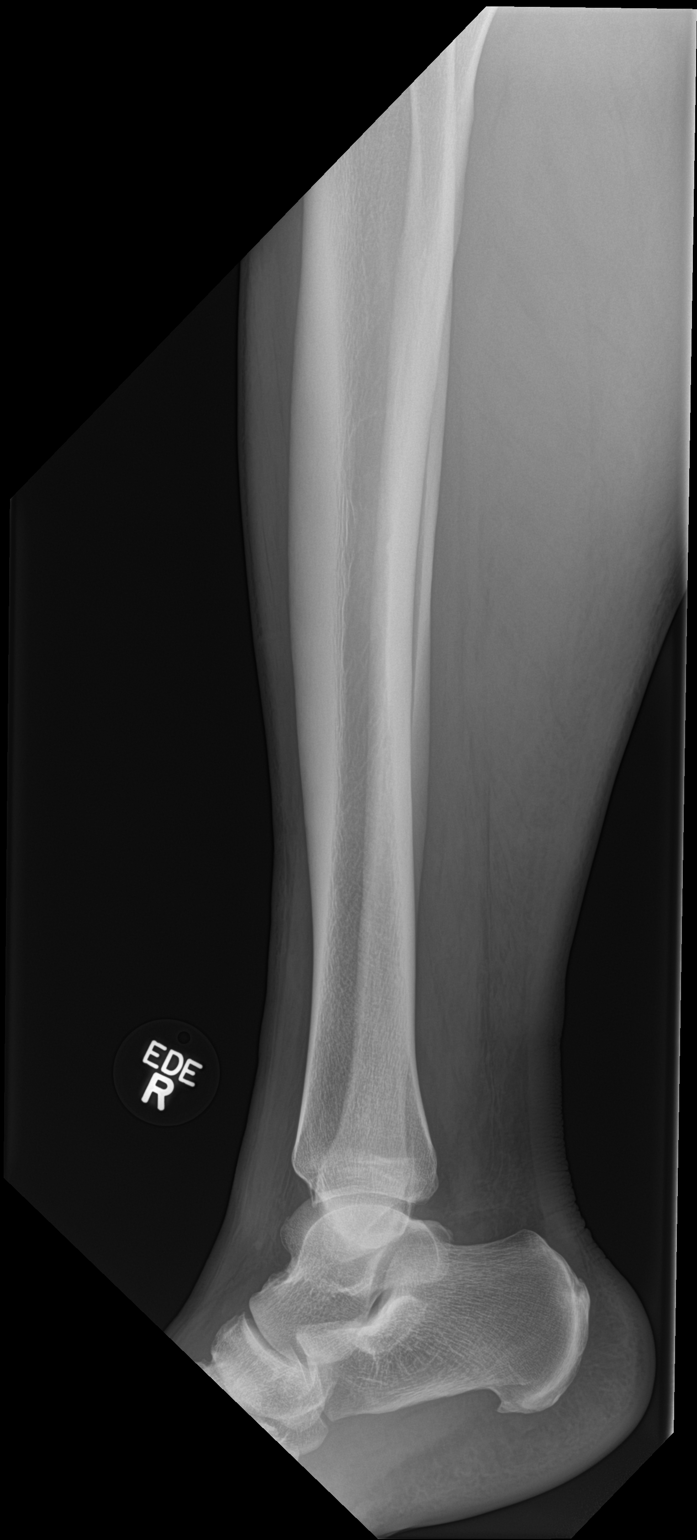

[tibia lat (2 of 2)]
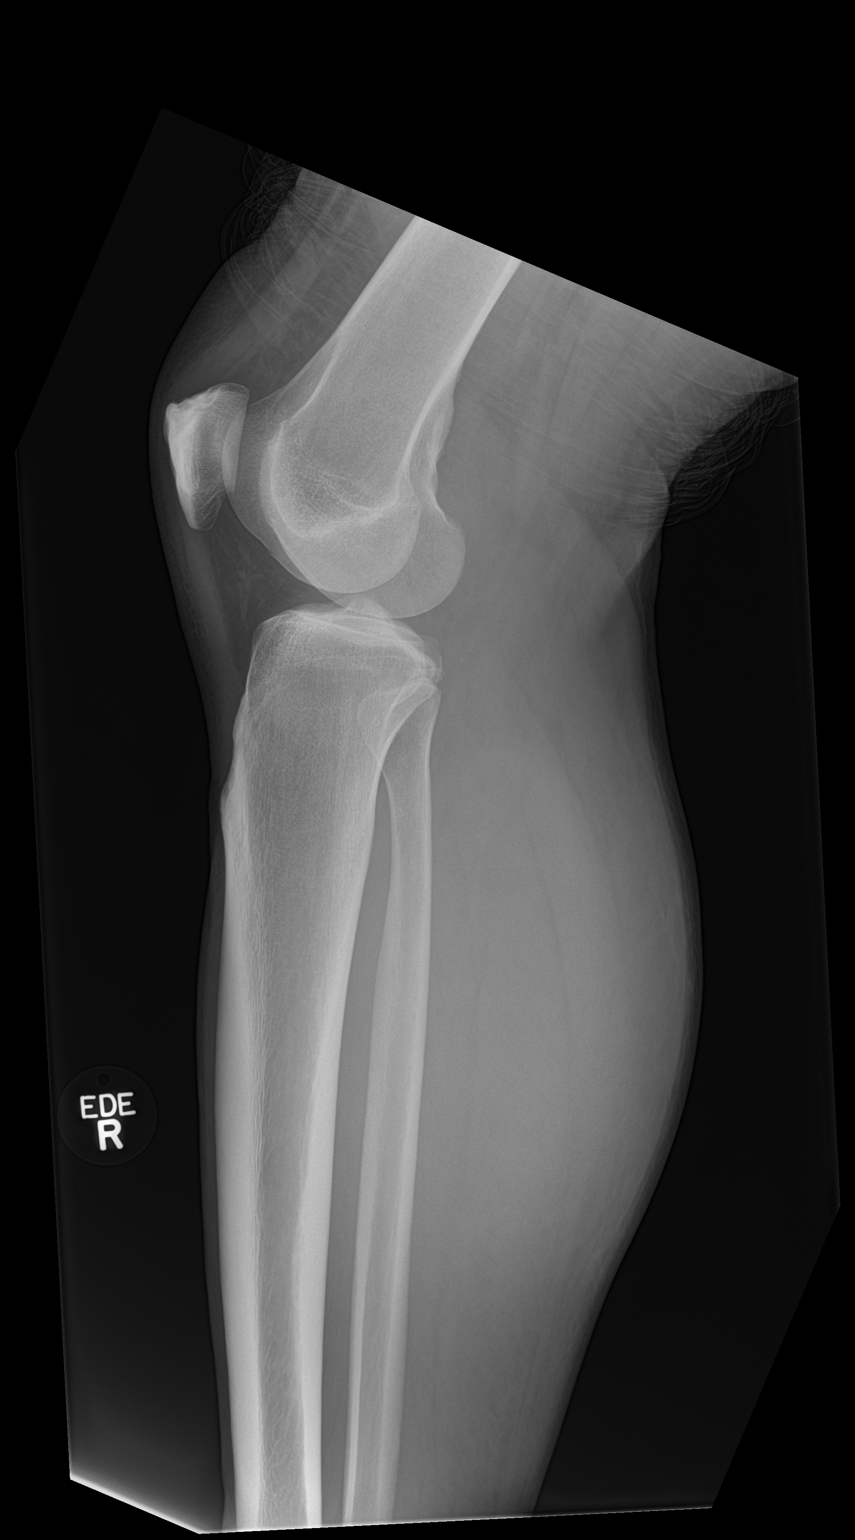

[4 of 4 positions shown; findings below may reference images not displayed]

FINDINGS: Frontal and lateral views were obtained. There is no fracture or
dislocation. Joint spaces appear normal. No knee or ankle joint
effusion. There is a small inferior calcaneal spur.
IMPRESSION: No fracture or dislocation. Joint spaces appear unremarkable. Small
inferior calcaneal spur.

## 2019-11-07 IMAGING — DX DG FINGER RING 2+V*R*
3 series · 3 of 3 positions shown · non-contrast
Comparison: None.

CLINICAL DATA: Recent injury to the distal right fourth finger with
swelling and bruising and pain

EXAM:
RIGHT RING FINGER 2+V

[finger ap]
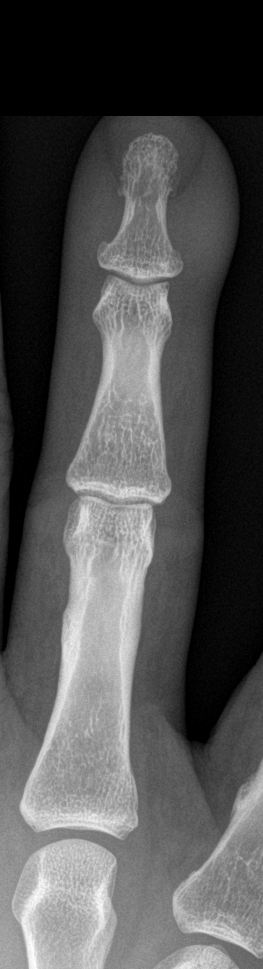

[finger lat]
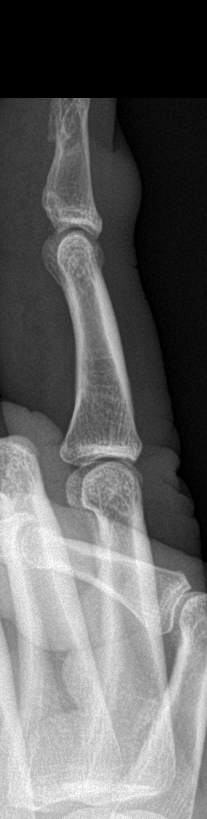

[finger obl]
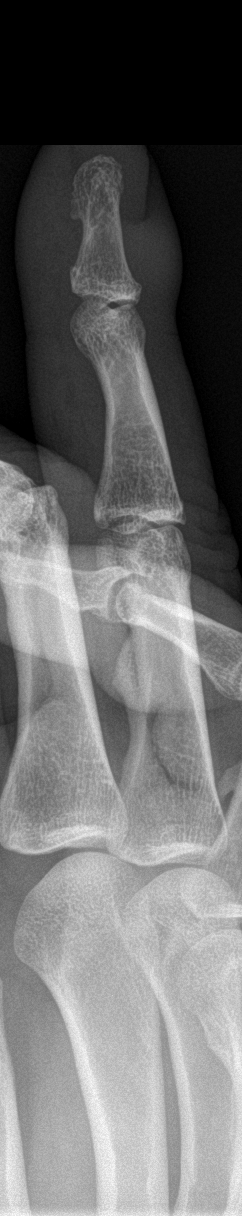

[3 of 3 positions shown; findings below may reference images not displayed]

FINDINGS: Soft tissue swelling in the distal right fourth finger. No fracture
or dislocation. No suspicious focal osseous lesion. No significant
arthropathy. No osseous erosions. No radiopaque foreign body.
IMPRESSION: Distal right fourth finger soft tissue swelling, with no fracture or
malalignment. No specific radiographic findings of osteomyelitis.

## 2020-01-10 ENCOUNTER — Ambulatory Visit
Admission: EM | Admit: 2020-01-10 | Discharge: 2020-01-10 | Disposition: A | Discharge status: Home / Self Care | Payer: Self-pay

## 2020-01-10 ENCOUNTER — Encounter: Payer: Self-pay | Admitting: Emergency Medicine

## 2020-01-10 ENCOUNTER — Other Ambulatory Visit: Payer: Self-pay

## 2020-01-10 DIAGNOSIS — Z20822 Contact with and (suspected) exposure to covid-19: Secondary | ICD-10-CM

## 2020-01-10 DIAGNOSIS — R059 Cough, unspecified: Secondary | ICD-10-CM

## 2020-01-10 DIAGNOSIS — J4521 Mild intermittent asthma with (acute) exacerbation: Secondary | ICD-10-CM

## 2020-01-10 HISTORY — DX: Unspecified asthma, uncomplicated: J45.909

## 2020-01-10 MED ORDER — ALBUTEROL SULFATE HFA 108 (90 BASE) MCG/ACT IN AERS: 2.0000 | INHALATION_SPRAY | Freq: Four times a day (QID) | RESPIRATORY_TRACT | 0 refills | Status: DC | PRN

## 2020-01-10 MED ORDER — FLOVENT HFA 220 MCG/ACT IN AERO
2.0000 | INHALATION_SPRAY | Freq: Two times a day (BID) | RESPIRATORY_TRACT | 0 refills | Status: DC
Start: 2020-01-10 — End: 2022-01-30

## 2020-01-10 MED ORDER — AEROCHAMBER PLUS FLO-VU MEDIUM MISC: 1.0000 | Freq: Once | 0 refills | Status: AC

## 2020-01-10 MED ORDER — BENZONATATE 100 MG PO CAPS: 100.0000 mg | ORAL_CAPSULE | Freq: Three times a day (TID) | ORAL | 0 refills | Status: DC | PRN

## 2020-01-10 MED ORDER — PREDNISONE 50 MG PO TABS: 50.0000 mg | ORAL_TABLET | Freq: Every day | ORAL | 0 refills | Status: DC

## 2020-01-10 NOTE — ED Triage Notes (Signed)
Pt here for cough and wheezing from asthma; pt sts out of inhaler

## 2020-01-10 NOTE — Discharge Instructions (Addendum)
Your COVID test is pending - it is important to quarantine / isolate at home until your results are back. °If you test positive and would like further evaluation for persistent or worsening symptoms, you may schedule an E-visit or virtual (video) visit throughout the  MyChart app or website. ° °PLEASE NOTE: If you develop severe chest pain or shortness of breath please go to the ER or call 9-1-1 for further evaluation --> DO NOT schedule electronic or virtual visits for this. °Please call our office for further guidance / recommendations as needed. ° °For information about the Covid vaccine, please visit [email or web address] °

## 2020-01-10 NOTE — ED Provider Notes (Signed)
[facility]    CSN: [id number] Arrival date & time: 01/10/20  0947      History   Chief Complaint Chief Complaint  Patient presents with  . Asthma    HPI Timothy Soto is a 38 y.o. male  Presenting for dry cough, wheezing for the last few days.  States he is out of his inhaler.  States this happens "every season change ".  Endorsing history of asthma: Has needed steroids in the past, though denies hospitalization for respiratory illness.  No known sick contacts, fever, chest pain or palpitations.  Past Medical History:  Diagnosis Date  . Asthma     There are no problems to display for this patient.   Past Surgical History:  Procedure Laterality Date  . HAND SURGERY  2009       Home Medications    Prior to Admission medications   Medication Sig Start Date End Date Taking? Authorizing Provider  acetaminophen (TYLENOL) 500 MG tablet Take 1,000 mg by mouth every 6 (six) hours as needed for moderate pain.    [provider]  albuterol (VENTOLIN HFA) 108 (90 Base) MCG/ACT inhaler Inhale 2 puffs into the lungs every 6 (six) hours as needed for wheezing or shortness of breath. 01/10/20   Hall-Potvin, [place], PA-C  benzonatate (TESSALON) 100 MG capsule Take 1 capsule (100 mg total) by mouth 3 (three) times daily as needed for cough. 01/10/20   Hall-Potvin, [place], PA-C  fluticasone (FLOVENT HFA) 220 MCG/ACT inhaler Inhale 2 puffs into the lungs in the morning and at bedtime. 01/10/20   Hall-Potvin, [place], PA-C  predniSONE (DELTASONE) 50 MG tablet Take 1 tablet (50 mg total) by mouth daily with breakfast. 01/10/20   Hall-Potvin, [place], PA-C  Spacer/Aero-Holding Chambers (AEROCHAMBER PLUS FLO-VU MEDIUM) MISC 1 each by Other route once for 1 dose. 01/10/20 01/10/20  Hall-Potvin, [place], PA-C  triamcinolone ointment (KENALOG) 0.5 % Apply 1 application topically 2 (two) times daily. 02/17/18   Bethel Born, PA-C    Family History Family  History  Family history unknown: Yes    Social History Social History   Tobacco Use  . Smoking status: Current Every Day Smoker    Packs/day: 0.50    Types: Cigarettes  . Smokeless tobacco: Never Used  Substance Use Topics  . Alcohol use: Yes    Comment: occasionally  . Drug use: Yes    Types: Marijuana    Comment: occasionally     Allergies   Shellfish allergy   Review of Systems As per HPI   Physical Exam Triage Vital Signs ED Triage Vitals  Enc Vitals Group     BP      Pulse      Resp      Temp      Temp src      SpO2      Weight      Height      Head Circumference      Peak Flow      Pain Score      Pain Loc      Pain Edu?      Excl. in GC?    No data found.  Updated Vital Signs BP (!) 141/80 (BP Location: Left Arm)   Pulse 90   Temp 97.9 F (36.6 C) (Oral)   Resp 18   SpO2 96%   Visual Acuity Right Eye Distance:   Left Eye Distance:   Bilateral Distance:    Right Eye  Near:   Left Eye Near:    Bilateral Near:     Physical Exam Constitutional:      General: He is not in acute distress.    Appearance: He is obese. He is not toxic-appearing or diaphoretic.  HENT:     Head: Normocephalic and atraumatic.     Mouth/Throat:     Mouth: Mucous membranes are moist.     Pharynx: Oropharynx is clear.  Eyes:     General: No scleral icterus.    Conjunctiva/sclera: Conjunctivae normal.     Pupils: Pupils are equal, round, and reactive to light.  Neck:     Comments: Trachea midline, negative JVD Cardiovascular:     Rate and Rhythm: Normal rate and regular rhythm.  Pulmonary:     Effort: Pulmonary effort is normal. No respiratory distress.     Breath sounds: No stridor. Wheezing present. No rhonchi or rales.     Comments: Decreased breath sounds in bilateral bases Musculoskeletal:     Cervical back: Neck supple. No tenderness.  Lymphadenopathy:     Cervical: No cervical adenopathy.  Skin:    Capillary Refill: Capillary refill takes less  than 2 seconds.     Coloration: Skin is not jaundiced or pale.     Findings: No rash.  Neurological:     Mental Status: He is alert and oriented to person, place, and time.      UC Treatments / Results  Labs (all labs ordered are listed, but only abnormal results are displayed) Labs Reviewed  NOVEL CORONAVIRUS, NAA    EKG   Radiology No results found.  Procedures Procedures (including critical care time)  Medications Ordered in UC Medications - No data to display  Initial Impression / Assessment and Plan / UC Course  I have reviewed the triage vital signs and the nursing notes.  Pertinent labs & imaging results that were available during my care of the patient were reviewed by me and considered in my medical decision making (see chart for details).     Patient afebrile, nontoxic, with SpO2 96%.  Covid PCR pending.  Patient to quarantine until results are back.  We will treat supportively as outlined below, likely asthma exacerbation 2/2 seasonal allergies.  Return precautions discussed, patient verbalized understanding and is agreeable to plan. Final Clinical Impressions(s) / UC Diagnoses   Final diagnoses:  Encounter for screening laboratory testing for COVID-19 virus  Cough  Mild intermittent asthma with acute exacerbation     Discharge Instructions     Your COVID test is pending - it is important to quarantine / isolate at home until your results are back. If you test positive and would like further evaluation for persistent or worsening symptoms, you may schedule an E-visit or virtual (video) visit throughout the [facility] app or website.  PLEASE NOTE: If you develop severe chest pain or shortness of breath please go to the ER or call 9-1-1 for further evaluation --> DO NOT schedule electronic or virtual visits for this. Please call our office for further guidance / recommendations as needed.  For information about the Covid vaccine, please visit  [email or web address]    ED Prescriptions    Medication Sig Dispense Auth. Provider   benzonatate (TESSALON) 100 MG capsule Take 1 capsule (100 mg total) by mouth 3 (three) times daily as needed for cough. 21 capsule Hall-Potvin, [place], PA-C   albuterol (VENTOLIN HFA) 108 (90 Base) MCG/ACT inhaler Inhale 2 puffs into the lungs every 6 (six)  hours as needed for wheezing or shortness of breath. 18 g Hall-Potvin, [place], PA-C   predniSONE (DELTASONE) 50 MG tablet Take 1 tablet (50 mg total) by mouth daily with breakfast. 5 tablet Hall-Potvin, [place], PA-C   fluticasone (FLOVENT HFA) 220 MCG/ACT inhaler Inhale 2 puffs into the lungs in the morning and at bedtime. 1 each Hall-Potvin, [place], PA-C   Spacer/Aero-Holding Chambers (AEROCHAMBER PLUS FLO-VU MEDIUM) MISC 1 each by Other route once for 1 dose. 1 each Hall-Potvin, [place], PA-C     PDMP not reviewed this encounter.   Hall-Potvin, [place], PA-C 01/10/20 1051

## 2020-01-11 LAB — NOVEL CORONAVIRUS, NAA: SARS-CoV-2, NAA: NOT DETECTED

## 2020-01-11 LAB — SARS-COV-2, NAA 2 DAY TAT

## 2020-03-28 ENCOUNTER — Emergency Department (HOSPITAL_COMMUNITY): Payer: 59

## 2020-03-28 ENCOUNTER — Emergency Department (HOSPITAL_COMMUNITY)
Admission: EM | Admit: 2020-03-28 | Discharge: 2020-03-29 | Disposition: A | Discharge status: Home / Self Care | Payer: 59 | Attending: Emergency Medicine | Admitting: Emergency Medicine

## 2020-03-28 ENCOUNTER — Encounter (HOSPITAL_COMMUNITY): Payer: Self-pay | Admitting: Emergency Medicine

## 2020-03-28 ENCOUNTER — Emergency Department (HOSPITAL_COMMUNITY): Admission: EM | Admit: 2020-03-28 | Discharge: 2020-03-28 | Discharge status: Against Medical Advice | Payer: 59

## 2020-03-28 ENCOUNTER — Other Ambulatory Visit: Payer: Self-pay

## 2020-03-28 DIAGNOSIS — F17210 Nicotine dependence, cigarettes, uncomplicated: Secondary | ICD-10-CM | POA: Insufficient documentation

## 2020-03-28 DIAGNOSIS — J45909 Unspecified asthma, uncomplicated: Secondary | ICD-10-CM | POA: Insufficient documentation

## 2020-03-28 DIAGNOSIS — Z7951 Long term (current) use of inhaled steroids: Secondary | ICD-10-CM | POA: Diagnosis not present

## 2020-03-28 DIAGNOSIS — M545 Low back pain, unspecified: Secondary | ICD-10-CM | POA: Diagnosis not present

## 2020-03-28 DIAGNOSIS — M549 Dorsalgia, unspecified: Secondary | ICD-10-CM | POA: Diagnosis present

## 2020-03-28 MED ORDER — OXYCODONE-ACETAMINOPHEN 5-325 MG PO TABS
1.0000 | ORAL_TABLET | ORAL | Status: DC | PRN
  Filled 2020-03-28: qty 1

## 2020-03-28 NOTE — ED Triage Notes (Signed)
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 Pt reports pain is worse with walking. VSS, NAD at present.

## 2020-03-29 ENCOUNTER — Encounter (HOSPITAL_COMMUNITY): Payer: Self-pay | Admitting: Emergency Medicine

## 2020-03-29 MED ORDER — LIDOCAINE 5 % EX PTCH
2.0000 | MEDICATED_PATCH | CUTANEOUS | Status: DC
  Administered 2020-03-29: 2 via TRANSDERMAL
  Filled 2020-03-29: qty 2

## 2020-03-29 MED ORDER — NAPROXEN 375 MG PO TABS: 375.0000 mg | ORAL_TABLET | Freq: Two times a day (BID) | ORAL | 0 refills | Status: DC

## 2020-03-29 MED ORDER — NAPROXEN 250 MG PO TABS
500.0000 mg | ORAL_TABLET | Freq: Once | ORAL | Status: AC
  Administered 2020-03-29: 500 mg via ORAL
  Filled 2020-03-29: qty 2

## 2020-03-29 MED ORDER — LIDOCAINE 5 % EX PTCH: 1.0000 | MEDICATED_PATCH | CUTANEOUS | 0 refills | Status: DC

## 2020-03-29 NOTE — ED Provider Notes (Signed)
[facility] EMERGENCY DEPARTMENT Provider Note   CSN: [id number] Arrival date & time: 03/28/20  2209     History Chief Complaint  Patient presents with  . Back Pain    Timothy Soto is a 40 y.o. male.  The history is provided by the patient.  Back Pain Location:  Sacro-iliac joint Quality:  Cramping Pain severity:  Severe Pain is:  Same all the time Onset quality:  Gradual Duration:  4 days Timing:  Constant Progression:  Worsening Chronicity:  New Context: lifting heavy objects and twisting   Relieved by:  Nothing Worsened by:  Nothing Ineffective treatments: last took tylenol on Saturday  Associated symptoms: no abdominal pain, no abdominal swelling, no bladder incontinence, no bowel incontinence, no chest pain, no dysuria, no fever, no headaches, no leg pain, no numbness, no paresthesias, no pelvic pain, no perianal numbness, no tingling, no weakness and no weight loss   Risk factors: no hx of cancer        Past Medical History:  Diagnosis Date  . Asthma     There are no problems to display for this patient.   Past Surgical History:  Procedure Laterality Date  . HAND SURGERY  2009       Family History  Family history unknown: Yes    Social History   Tobacco Use  . Smoking status: Current Every Day Smoker    Packs/day: 0.50    Types: Cigarettes  . Smokeless tobacco: Never Used  Substance Use Topics  . Alcohol use: Yes    Comment: occasionally  . Drug use: Yes    Types: Marijuana    Comment: occasionally    Home Medications Prior to Admission medications   Medication Sig Start Date End Date Taking? Authorizing Provider  acetaminophen (TYLENOL) 500 MG tablet Take 1,000 mg by mouth every 6 (six) hours as needed for moderate pain.    [provider]  albuterol (VENTOLIN HFA) 108 (90 Base) MCG/ACT inhaler Inhale 2 puffs into the lungs every 6 (six) hours as needed for wheezing or shortness of breath. 01/10/20    Hall-Potvin, [place], PA-C  benzonatate (TESSALON) 100 MG capsule Take 1 capsule (100 mg total) by mouth 3 (three) times daily as needed for cough. 01/10/20   Hall-Potvin, [place], PA-C  fluticasone (FLOVENT HFA) 220 MCG/ACT inhaler Inhale 2 puffs into the lungs in the morning and at bedtime. 01/10/20   Hall-Potvin, [place], PA-C  predniSONE (DELTASONE) 50 MG tablet Take 1 tablet (50 mg total) by mouth daily with breakfast. 01/10/20   Hall-Potvin, [place], PA-C  triamcinolone ointment (KENALOG) 0.5 % Apply 1 application topically 2 (two) times daily. 02/17/18   Bethel Born, PA-C    Allergies    Shellfish allergy  Review of Systems   Review of Systems  Constitutional: Negative for fever and weight loss.  HENT: Negative for congestion.   Eyes: Negative for visual disturbance.  Respiratory: Negative for shortness of breath.   Cardiovascular: Negative for chest pain.  Gastrointestinal: Negative for abdominal pain and bowel incontinence.  Genitourinary: Negative for bladder incontinence, dysuria and pelvic pain.  Musculoskeletal: Positive for back pain.  Neurological: Negative for tingling, weakness, numbness, headaches and paresthesias.  Psychiatric/Behavioral: Negative for agitation.  All other systems reviewed and are negative.   Physical Exam Updated Vital Signs BP 119/68 (BP Location: Right Arm)   Pulse 81   Temp 97.9 F (36.6 C) (Oral)   Resp 16   SpO2 100%   Physical Exam  Vitals and nursing note reviewed.  Constitutional:      General: He is not in acute distress.    Appearance: Normal appearance.  HENT:     Head: Normocephalic and atraumatic.     Nose: Nose normal.  Eyes:     Conjunctiva/sclera: Conjunctivae normal.     Pupils: Pupils are equal, round, and reactive to light.  Cardiovascular:     Rate and Rhythm: Normal rate and regular rhythm.     Pulses: Normal pulses.     Heart sounds: Normal heart sounds.  Pulmonary:     Effort: Pulmonary effort is  normal.     Breath sounds: Normal breath sounds.  Abdominal:     General: Abdomen is flat. Bowel sounds are normal.     Palpations: Abdomen is soft.     Tenderness: There is no abdominal tenderness. There is no guarding.  Musculoskeletal:        General: Normal range of motion.     Cervical back: Normal, normal range of motion and neck supple.     Thoracic back: Normal.     Lumbar back: Normal.  Skin:    General: Skin is warm and dry.     Capillary Refill: Capillary refill takes less than 2 seconds.  Neurological:     General: No focal deficit present.     Mental Status: He is alert and oriented to person, place, and time.     Deep Tendon Reflexes: Reflexes normal.  Psychiatric:        Mood and Affect: Mood normal.        Behavior: Behavior normal.     ED Results / Procedures / Treatments   Labs (all labs ordered are listed, but only abnormal results are displayed) Labs Reviewed - No data to display  EKG None  Radiology DG Lumbar Spine Complete  Result Date: 03/28/2020 CLINICAL DATA:  Pain EXAM: LUMBAR SPINE - COMPLETE 4+ VIEW COMPARISON:  None. FINDINGS: Normal alignment. Loss of cervical lordosis. Degenerative changes in at L5-S1 with disc space narrowing and spurring. Diffuse degenerative facet disease. No fracture. SI joints symmetric and unremarkable. IMPRESSION: Degenerative changes and straightening.  No acute bony abnormality. Electronically Signed   By: Charlett Nose M.D.   On: 03/28/2020 23:01    Procedures Procedures (including critical care time)  Medications Ordered in ED Medications  oxyCODONE-acetaminophen (PERCOCET/ROXICET) 5-325 MG per tablet 1 tablet (1 tablet Oral Incomplete 03/28/20 2223)  lidocaine (LIDODERM) 5 % 2 patch (has no administration in time range)  naproxen (NAPROSYN) tablet 500 mg (has no administration in time range)    ED Course  I have reviewed the triage vital signs and the nursing notes.  Pertinent labs & imaging results that were  available during my care of the patient were reviewed by me and considered in my medical decision making (see chart for details).    Hydrate well taken medication as directed.   Final Clinical Impression(s) / ED Diagnoses Return for intractable cough, coughing up blood,fevers >100.4 unrelieved by medication, shortness of breath, intractable vomiting, chest pain, shortness of breath, weakness,numbness, changes in speech, facial asymmetry,abdominal pain, passing out,Inability to tolerate liquids or food, cough, altered mental status or any concerns. No signs of systemic illness or infection. The patient is nontoxic-appearing on exam and vital signs are within normal limits.   I have reviewed the triage vital signs and the nursing notes. Pertinent labs &imaging results that were available during my care of the patient were reviewed by me  and considered in my medical decision making (see chart for details).After history, exam, and medical workup I feel the patient has beenappropriately medically screened and is safe for discharge home. Pertinent diagnoses were discussed with the patient. Patient was given return precautions.      Keanen Dohse, MD 03/29/20 [phone]

## 2020-04-21 ENCOUNTER — Other Ambulatory Visit: Payer: Self-pay | Admitting: *Deleted

## 2020-04-21 NOTE — Patient Outreach (Signed)
[facility]) Care Management  04/21/2020  Timothy Soto 20-Nov-1980 [id number]   Referral Received 04/20/2020 Initial Outreach 04/21/2020  Telephone Screen-Successful (No additional needs)   RN spoke directly with the pt today and introduced [facility] services and the purpose for today's call. Discussed his recent ED visits. 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 States he injured his back in January and wen to the ED. Pt states he was provided medication that he has completed and continue to have some pain. Pt is currently working back on the job. RN inquired if this maybe a workmen's compensation case to report due to the injury occurring on the job. Pt response was he did not wish to report it because he "loves" the job. The following was today's conversation:  *Finding a primary provider and provided pt with several resources and #'s to call. [place] [facility] both contact numbers and [facility] navigation [phone]. Also discussed [employer] rewards program if he is able to access the website to complete the enrollment. RN Strongly encouraged pt to call and arrange for a primary care provider who could assist with all his current issue and make a referral to a specialist to further address his ongoing back pain.    *Inquired visiting the urgent care centers vs the ED. Pt states to crowded and he is able to be seen by the doctor quicker at the hospitals. RN strongly encourage adherence with proceeding with finding a primary provided with the resources provided above. Educated pt on having a primary provider for needed referrals and quicker resolutions with virtual phones if allowable by the provider verse the ED or urgent care.   Further discussion on any other medical or issues at this time. Screening completed with only OTC medications as pt has completed all prescribed medications from the last ED visit for naprosyn, prednisone and lidocaine patches.  Inquired on any other needs (declined). Pt states this is the information ne needs and was needing to search for a provider. Pt very appreciative for the information provider but only has seasonal allergies that are currently under control and pt states he "fells great" with no additional issues or needs at this time. Pt aware on how to contact [facility] office if additional assistance is needed. No other needs to address at this time as pt strongly encouraged to call the numbers to find a provider of choice for his ongoing medical issues.    Elliot Cousin, RN Care Management Coordinator [facility] Main Office [phone]

## 2020-07-29 ENCOUNTER — Ambulatory Visit
Admission: EM | Admit: 2020-07-29 | Discharge: 2020-07-29 | Disposition: A | Discharge status: Home / Self Care | Payer: 59 | Attending: Family Medicine | Admitting: Family Medicine

## 2020-07-29 ENCOUNTER — Other Ambulatory Visit: Payer: Self-pay

## 2020-07-29 ENCOUNTER — Encounter: Payer: Self-pay | Admitting: Emergency Medicine

## 2020-07-29 DIAGNOSIS — R509 Fever, unspecified: Secondary | ICD-10-CM | POA: Diagnosis not present

## 2020-07-29 DIAGNOSIS — U071 COVID-19: Secondary | ICD-10-CM

## 2020-07-29 NOTE — ED Triage Notes (Signed)
Pt here for fever and body aches x 2 days; pt had home covid test

## 2020-07-29 NOTE — ED Provider Notes (Signed)
  [facility]   [id number] 07/29/20 Arrival Time: 4259  ASSESSMENT & PLAN:  1. Subjective fever   2. COVID-19 virus infection     COVID-19 testing sent upon pt request; needs for work. OTC symptom care as needed.    Follow-up Information    [facility] at [facility] .   Specialty: Urgent Care Why: If worsening or failing to improve as anticipated. Contact information: [address] [id number] mc [place] [address] [phone]              Reviewed expectations re: course of current medical issues. Questions answered. Outlined signs and symptoms indicating need for more acute intervention. Understanding verbalized. After Visit Summary given.   SUBJECTIVE: History from: patient. Timothy Soto is a 40 y.o. male who presents with worries regarding COVID-19. Known COVID-19 contact: none. Recent travel: none. Reports: subj fever and body aches; abrupt onset; x 2 d. Denies: difficulty breathing. Normal PO intake without n/v/d. Reports + home COVID test.   OBJECTIVE:  Vitals:   07/29/20 0822  BP: (!) 149/91  Pulse: 94  Resp: 18  Temp: 98.5 F (36.9 C)  TempSrc: Oral  SpO2: 96%    General appearance: alert; no distress; appears fatigued Eyes: PERRLA; EOMI; conjunctiva normal HENT: [place]; AT; with nasal congestion Neck: supple  Lungs: speaks full sentences without difficulty; unlabored Extremities: no edema Skin: warm and dry Neurologic: normal gait Psychological: alert and cooperative; normal mood and affect  Labs:  Labs Reviewed  NOVEL CORONAVIRUS, NAA    Allergies  Allergen Reactions  . Shellfish Allergy     Past Medical History:  Diagnosis Date  . Asthma    Social History   Socioeconomic History  . Marital status: Single    Spouse name: Not on file  . Number of children: Not on file  . Years of education: Not on file  . Highest education level: Not on file  Occupational History   . Not on file  Tobacco Use  . Smoking status: Current Every Day Smoker    Packs/day: 0.50    Types: Cigarettes  . Smokeless tobacco: Never Used  Substance and Sexual Activity  . Alcohol use: Yes    Comment: occasionally  . Drug use: Yes    Types: Marijuana    Comment: occasionally  . Sexual activity: Not on file  Other Topics Concern  . Not on file  Social History Narrative  . Not on file   Social Determinants of Health   Financial Resource Strain: Not on file  Food Insecurity: Not on file  Transportation Needs: Not on file  Physical Activity: Not on file  Stress: Not on file  Social Connections: Not on file  Intimate Partner Violence: Not on file   Family History  Family history unknown: Yes   Past Surgical History:  Procedure Laterality Date  . HAND SURGERY  2009     Mardella Layman, MD 07/29/20 [phone]

## 2020-07-30 ENCOUNTER — Telehealth (HOSPITAL_COMMUNITY): Payer: Self-pay | Admitting: Emergency Medicine

## 2020-07-30 LAB — NOVEL CORONAVIRUS, NAA: SARS-CoV-2, NAA: DETECTED — AB

## 2020-07-30 LAB — SARS-COV-2, NAA 2 DAY TAT

## 2020-07-30 NOTE — Telephone Encounter (Signed)
Opened in error

## 2020-07-31 ENCOUNTER — Other Ambulatory Visit: Payer: Self-pay | Admitting: Physician Assistant

## 2020-07-31 ENCOUNTER — Other Ambulatory Visit (HOSPITAL_COMMUNITY): Payer: Self-pay

## 2020-07-31 MED ORDER — MOLNUPIRAVIR EUA 200MG CAPSULE
4.0000 | ORAL_CAPSULE | Freq: Two times a day (BID) | ORAL | 0 refills | Status: AC
  Filled 2020-07-31: qty 40, 5d supply, fill #0

## 2020-07-31 NOTE — Progress Notes (Signed)
Outpatient Oral COVID Treatment Note  I connected with Timothy Soto on 07/31/2020/12:22 PM by telephone and verified that I am speaking with the correct person using two identifiers.  I discussed the limitations, risks, security, and privacy concerns of performing an evaluation and management service by telephone and the availability of in person appointments. I also discussed with the patient that there may be a patient responsible charge related to this service. The patient expressed understanding and agreed to proceed.  Patient location: Home Provider location: Home  Diagnosis: COVID-19 infection  Purpose of visit: Discussion of potential use of Molnupiravir or Paxlovid, a new treatment for mild to moderate COVID-19 viral infection in non-hospitalized patients.   Subjective: Patient is a 40 y.o. male who has been diagnosed with COVID 19 viral infection.  Their symptoms began on 07/29/20 with cough, runny nose and intermittent fever.   Past Medical History:  Diagnosis Date  . Asthma     Allergies  Allergen Reactions  . Shellfish Allergy      Current Outpatient Medications:  .  acetaminophen (TYLENOL) 500 MG tablet, Take 1,000 mg by mouth every 6 (six) hours as needed for moderate pain., Disp: , Rfl:  .  albuterol (VENTOLIN HFA) 108 (90 Base) MCG/ACT inhaler, Inhale 2 puffs into the lungs every 6 (six) hours as needed for wheezing or shortness of breath., Disp: 18 g, Rfl: 0 .  benzonatate (TESSALON) 100 MG capsule, Take 1 capsule (100 mg total) by mouth 3 (three) times daily as needed for cough., Disp: 21 capsule, Rfl: 0 .  fluticasone (FLOVENT HFA) 220 MCG/ACT inhaler, Inhale 2 puffs into the lungs in the morning and at bedtime. (Patient taking differently: Inhale 2 puffs into the lungs in the morning and at bedtime. Take as needed), Disp: 1 each, Rfl: 0 .  lidocaine (LIDODERM) 5 %, Place 1 patch onto the skin daily. Remove & Discard patch within 12 hours or as directed by MD, Disp:  30 patch, Rfl: 0 .  naproxen (NAPROSYN) 375 MG tablet, Take 1 tablet (375 mg total) by mouth 2 (two) times daily with a meal. (Patient taking differently: Take 375 mg by mouth 2 (two) times daily with a meal. Completed this medication), Disp: 14 tablet, Rfl: 0 .  predniSONE (DELTASONE) 50 MG tablet, Take 1 tablet (50 mg total) by mouth daily with breakfast. (Patient taking differently: Take 50 mg by mouth daily with breakfast. Completed this medication), Disp: 5 tablet, Rfl: 0 .  triamcinolone ointment (KENALOG) 0.5 %, Apply 1 application topically 2 (two) times daily. (Patient not taking: Reported on 04/21/2020), Disp: 30 g, Rfl: 0  Objective: Patient appears/sounds Sick.  They are in no apparent distress.  Breathing is non labored.  Mood and behavior are normal.  Laboratory Data:  Recent Results (from the past 2160 hour(s))  Novel Coronavirus, NAA ([facility])     Status: Abnormal   Collection Time: 07/29/20  8:42 AM   Specimen: Nasopharyngeal(NP) swabs in vial transport medium   Nasopharynge  Result Value Ref Range   SARS-CoV-2, NAA Detected (A) Not Detected    Comment: Patients who have a positive COVID-19 test result may now have treatment options. Treatment options are available for patients with mild to moderate symptoms and for hospitalized patients. Visit our website at [email or web address] for resources and information. This nucleic acid amplification test was developed and its performance characteristics determined by [employer]. Nucleic acid amplification tests include RT-PCR and TMA. This test has not been FDA cleared or approved.  This test has been authorized by FDA under an Emergency Use Authorization (EUA). This test is only authorized for the duration of time the declaration that circumstances exist justifying the authorization of the emergency use of in vitro diagnostic tests for detection of SARS-CoV-2 virus and/or diagnosis of COVID-19 infection under  section 564(b)(1) of the Act, 21 U.S.C. 808UPJ-0(R) (1), unless the authorization is terminated or revoked sooner. When diagnostic testing is negativ e, the possibility of a false negative result should be considered in the context of a patient's recent exposures and the presence of clinical signs and symptoms consistent with COVID-19. An individual without symptoms of COVID-19 and who is not shedding SARS-CoV-2 virus would expect to have a negative (not detected) result in this assay.   SARS-COV-2, NAA 2 DAY TAT     Status: None   Collection Time: 07/29/20  8:42 AM   Nasopharynge  Result Value Ref Range   SARS-CoV-2, NAA 2 DAY TAT Performed      Assessment: 40 y.o. male with mild/moderate COVID 19 viral infection diagnosed on 07/30/20 at high risk for progression to severe COVID 19.  Plan:  This patient is a 40 y.o. male that meets the following criteria for Emergency Use Authorization of: Molnupiravir  1. Age >18 yr 2. SARS-COV-2 positive test 3. Symptom onset < 5 days 4. Mild-to-moderate COVID disease with high risk for severe progression to hospitalization or death   I have spoken and communicated the following to the patient or parent/caregiver regarding: 1. Molnupiravir is an unapproved drug that is authorized for use under an TEFL teacher.  2. There are no adequate, approved, available products for the treatment of COVID-19 in adults who have mild-to-moderate COVID-19 and are at high risk for progressing to severe COVID-19, including hospitalization or death. 3. Other therapeutics are currently authorized. For additional information on all products authorized for treatment or prevention of COVID-19, please see https://www.graham-miller.com/.  4. There are benefits and risks of taking this treatment as outlined in the "Fact Sheet for Patients and Caregivers."  5. "Fact  Sheet for Patients and Caregivers" was reviewed with patient. A hard copy will be provided to patient from pharmacy prior to the patient receiving treatment. 6. Patients should continue to self-isolate and use infection control measures (e.g., wear mask, isolate, social distance, avoid sharing personal items, clean and disinfect "high touch" surfaces, and frequent handwashing) according to CDC guidelines.  7. The patient or parent/caregiver has the option to accept or refuse treatment. 8. [employer] has established a pregnancy surveillance program. 9. Females of childbearing potential should use a reliable method of contraception correctly and consistently, as applicable, for the duration of treatment and for 4 days after the last dose of Molnupiravir. 10. Males of reproductive potential who are sexually active with females of childbearing potential should use a reliable method of contraception correctly and consistently during treatment and for at least 3 months after the last dose. 11. Pregnancy status and risk was assessed. Patient verbalized understanding of precautions.   After reviewing above information with the patient, the patient agrees to receive molnupiravir.  Follow up instructions:    . Take prescription BID x 5 days as directed . Reach out to pharmacist for counseling on medication if desired . For concerns regarding further COVID symptoms please follow up with your PCP or urgent care . For urgent or life-threatening issues, seek care at your local emergency department  The patient was provided an opportunity to ask questions,  and all were answered. The patient agreed with the plan and demonstrated an understanding of the instructions.   Script sent to [facility] and opted to pick up RX.  The patient was advised to call their PCP or seek an in-person evaluation if the symptoms worsen or if the condition fails to improve as anticipated.   I provided  15 minutes of non face-to-face telephone visit time during this encounter, and > 50% was spent counseling as documented under my assessment & plan.  [place], [place] 07/31/2020 /12:22 PM

## 2020-08-10 ENCOUNTER — Other Ambulatory Visit (HOSPITAL_COMMUNITY): Payer: Self-pay

## 2021-12-23 ENCOUNTER — Encounter: Payer: Self-pay | Admitting: Emergency Medicine

## 2021-12-23 ENCOUNTER — Ambulatory Visit
Admission: EM | Admit: 2021-12-23 | Discharge: 2021-12-23 | Disposition: A | Discharge status: Home / Self Care | Payer: 59 | Attending: Internal Medicine | Admitting: Internal Medicine

## 2021-12-23 DIAGNOSIS — B372 Candidiasis of skin and nail: Secondary | ICD-10-CM

## 2021-12-23 MED ORDER — FLUCONAZOLE 150 MG PO TABS: 150.0000 mg | ORAL_TABLET | ORAL | 0 refills | Status: DC

## 2021-12-23 MED ORDER — NYSTATIN 100000 UNIT/GM EX CREA: TOPICAL_CREAM | CUTANEOUS | 0 refills | Status: DC

## 2021-12-23 NOTE — Discharge Instructions (Signed)
It appears that you have a yeast infection of your skin which is being treated with a cream and an oral medication.  This could take a few weeks to resolve.  Please follow-up if symptoms persist or worsen.

## 2021-12-23 NOTE — ED Provider Notes (Signed)
[facility]    CSN: [id number] Arrival date & time: 12/23/21  1133      History   Chief Complaint Chief Complaint  Patient presents with   Rash    HPI Timothy Soto is a 41 y.o. male.   Patient presents with itchy rash in between buttcheeks that started about a month ago.  Patient reports that he has not tried any medications topically or orally for symptoms.  Denies any recent changes to lotions, soaps, detergents, foods, etc.  No known sick contacts or fevers.  Denies any obvious lesions to the buttocks as well.   Rash   Past Medical History:  Diagnosis Date   Asthma     There are no problems to display for this patient.   Past Surgical History:  Procedure Laterality Date   HAND SURGERY  2009       Home Medications    Prior to Admission medications   Medication Sig Start Date End Date Taking? Authorizing Provider  fluconazole (DIFLUCAN) 150 MG tablet Take 1 tablet (150 mg total) by mouth once a week. Take once weekly for 3 weeks 12/23/21  Yes [place], [place] E, [place]  nystatin cream (MYCOSTATIN) Apply to affected area 2 times daily 12/23/21  Yes [place], [place] E, [place]  acetaminophen (TYLENOL) 500 MG tablet Take 1,000 mg by mouth every 6 (six) hours as needed for moderate pain.    [provider]  albuterol (VENTOLIN HFA) 108 (90 Base) MCG/ACT inhaler Inhale 2 puffs into the lungs every 6 (six) hours as needed for wheezing or shortness of breath. 01/10/20   Hall-Potvin, [place], PA-C  benzonatate (TESSALON) 100 MG capsule Take 1 capsule (100 mg total) by mouth 3 (three) times daily as needed for cough. 01/10/20   Hall-Potvin, [place], PA-C  fluticasone (FLOVENT HFA) 220 MCG/ACT inhaler Inhale 2 puffs into the lungs in the morning and at bedtime. Patient taking differently: Inhale 2 puffs into the lungs in the morning and at bedtime. Take as needed 01/10/20   Hall-Potvin, [place], PA-C  lidocaine (LIDODERM) 5 % Place 1 patch onto the skin daily.  Remove & Discard patch within 12 hours or as directed by MD 03/29/20   Nicanor Alcon, April, MD  naproxen (NAPROSYN) 375 MG tablet Take 1 tablet (375 mg total) by mouth 2 (two) times daily with a meal. Patient taking differently: Take 375 mg by mouth 2 (two) times daily with a meal. Completed this medication 03/29/20   Palumbo, April, MD  predniSONE (DELTASONE) 50 MG tablet Take 1 tablet (50 mg total) by mouth daily with breakfast. Patient taking differently: Take 50 mg by mouth daily with breakfast. Completed this medication 01/10/20   Hall-Potvin, [place], PA-C  triamcinolone ointment (KENALOG) 0.5 % Apply 1 application topically 2 (two) times daily. Patient not taking: Reported on 04/21/2020 02/17/18   Bethel Born, PA-C    Family History Family History  Family history unknown: Yes    Social History Social History   Tobacco Use   Smoking status: Every Day    Packs/day: 0.50    Types: Cigarettes   Smokeless tobacco: Never  Substance Use Topics   Alcohol use: Yes    Comment: occasionally   Drug use: Yes    Types: Marijuana    Comment: occasionally     Allergies   Shellfish allergy   Review of Systems Review of Systems Per HPI  Physical Exam Triage Vital Signs ED Triage Vitals [12/23/21 1146]  Enc Vitals Group  BP (!) 145/97     Pulse Rate 88     Resp 17     Temp 98 F (36.7 C)     Temp src      SpO2 96 %     Weight      Height      Head Circumference      Peak Flow      Pain Score 0     Pain Loc      Pain Edu?      Excl. in GC?    No data found.  Updated Vital Signs BP (!) 145/97   Pulse 88   Temp 98 F (36.7 C)   Resp 17   SpO2 96%   Visual Acuity Right Eye Distance:   Left Eye Distance:   Bilateral Distance:    Right Eye Near:   Left Eye Near:    Bilateral Near:     Physical Exam Exam conducted with a chaperone present.  Constitutional:      General: He is not in acute distress.    Appearance: Normal appearance. He is not  toxic-appearing or diaphoretic.  HENT:     Head: Normocephalic and atraumatic.  Eyes:     Extraocular Movements: Extraocular movements intact.     Conjunctiva/sclera: Conjunctivae normal.  Pulmonary:     Effort: Pulmonary effort is normal.  Skin:    Comments: Scaly, flat, erythematous rash present to intergluteal cleft and that extends slightly to bilateral buttocks.  Neurological:     General: No focal deficit present.     Mental Status: He is alert and oriented to person, place, and time. Mental status is at baseline.  Psychiatric:        Mood and Affect: Mood normal.        Behavior: Behavior normal.        Thought Content: Thought content normal.        Judgment: Judgment normal.      UC Treatments / Results  Labs (all labs ordered are listed, but only abnormal results are displayed) Labs Reviewed - No data to display  EKG   Radiology No results found.  Procedures Procedures (including critical care time)  Medications Ordered in UC Medications - No data to display  Initial Impression / Assessment and Plan / UC Course  I have reviewed the triage vital signs and the nursing notes.  Pertinent labs & imaging results that were available during my care of the patient were reviewed by me and considered in my medical decision making (see chart for details).     Rash is consistent with candidiasis of the skin.  Will treat with Diflucan weekly and nystatin cream.  Advised patient to follow-up if symptoms persist or worsen.  Patient verbalized understanding and was agreeable with plan. Final Clinical Impressions(s) / UC Diagnoses   Final diagnoses:  Candidal skin infection     Discharge Instructions      It appears that you have a yeast infection of your skin which is being treated with a cream and an oral medication.  This could take a few weeks to resolve.  Please follow-up if symptoms persist or worsen.    ED Prescriptions     Medication Sig Dispense Auth.  Provider   nystatin cream (MYCOSTATIN) Apply to affected area 2 times daily 30 g Ervin Knack E, [place]   fluconazole (DIFLUCAN) 150 MG tablet Take 1 tablet (150 mg total) by mouth once a week. Take once weekly for  3 weeks 3 tablet [place], Michele Rockers, [place]      PDMP not reviewed this encounter.   Teodora Medici, [place] 12/23/21 1229

## 2021-12-23 NOTE — ED Triage Notes (Signed)
Pt is present today with a rash in between his buttock. Pt sx started x2 days ago

## 2022-01-30 ENCOUNTER — Ambulatory Visit
Admission: EM | Admit: 2022-01-30 | Discharge: 2022-01-30 | Disposition: A | Discharge status: Home / Self Care | Payer: 59 | Attending: Urgent Care | Admitting: Urgent Care

## 2022-01-30 DIAGNOSIS — L309 Dermatitis, unspecified: Secondary | ICD-10-CM

## 2022-01-30 MED ORDER — CLINDAMYCIN PHOSPHATE 1 % EX SWAB: 1.0000 | Freq: Two times a day (BID) | CUTANEOUS | 0 refills | Status: AC

## 2022-01-30 MED ORDER — CLOTRIMAZOLE-BETAMETHASONE 1-0.05 % EX CREA: TOPICAL_CREAM | CUTANEOUS | 0 refills | Status: AC

## 2022-01-30 NOTE — Discharge Instructions (Signed)
Your rash does have a fungal component, however I am concerned that you are having a dermatitis secondary to heat and sweat. Please use the topical antifungal/steroid cream twice daily for up to 2 weeks.  Do not exceed 14 days of use. Prior to applying this, use the topical clindamycin swab.  You will also use this twice daily. Should your symptoms persist despite the above treatment, we would recommend dermatology consultation.

## 2022-01-30 NOTE — ED Triage Notes (Signed)
Pt c/o rash on buttocks. States he was treated at this UC and medication has helped but has not resolved and he is nearly out of medication so requesting refill until rash has cleared.

## 2022-02-01 NOTE — ED Provider Notes (Signed)
[facility]    CSN: [id number] Arrival date & time: 01/30/22  0943      History   Chief Complaint Chief Complaint  Patient presents with   Rash         HPI Timothy Soto is a 41 y.o. Soto.   Timothy 41yo Soto with no significant past medical hx presents today due to concerns of continued rash. Was seen initially on 12/23/21 due to an itchy rash to his gluteal cleft. Was dx with candidal skin infection and sent home with three tabs of PO diflucan and topical nystatin cream. Pt states the pruritus responded slightly to the treatment but the rash is unchanged. Pt denies known cause. Works in a [employer] but this is not a new job and denies any chemical exposures. States Timothy Soto does sweat excessively at the job. His main concern is the degree of itching. Reports the rash is not worse, just not resolved. No new symptoms. Denies any forms of anal intercourse.    Rash   Past Medical History:  Diagnosis Date   Asthma     There are no problems to display for this patient.   Past Surgical History:  Procedure Laterality Date   HAND SURGERY  2009       Home Medications    Prior to Admission medications   Medication Sig Start Date End Date Taking? Authorizing Provider  clindamycin (CLEOCIN T) 1 % SWAB Apply 1 Dose topically 2 (two) times daily. 01/30/22  Yes Megumi Treaster L, PA  clotrimazole-betamethasone (LOTRISONE) cream Apply to affected area 2 times daily, do not exceed 14 days 01/30/22  Yes Shawnetta Lein L, PA  acetaminophen (TYLENOL) 500 MG tablet Take 1,000 mg by mouth every 6 (six) hours as needed for moderate pain.    [provider]    Family History Family History  Family history unknown: Yes    Social History Social History   Tobacco Use   Smoking status: Every Day    Packs/day: 0.50    Types: Cigarettes   Smokeless tobacco: Never  Substance Use Topics   Alcohol use: Yes    Comment: occasionally   Drug use: Yes     Types: Marijuana    Comment: occasionally     Allergies   Shellfish allergy   Review of Systems Review of Systems  Skin:  Positive for rash (buttocks with itching.).     Physical Exam Triage Vital Signs ED Triage Vitals  Enc Vitals Group     BP 01/30/22 1112 (!) 150/99     Pulse Rate 01/30/22 1112 87     Resp 01/30/22 1112 15     Temp 01/30/22 1112 97.9 F (36.6 C)     Temp Source 01/30/22 1112 Oral     SpO2 01/30/22 1112 97 %     Weight --      Height --      Head Circumference --      Peak Flow --      Pain Score 01/30/22 1116 0     Pain Loc --      Pain Edu? --      Excl. in GC? --    No data found.  Updated Vital Signs BP (!) 150/99 (BP Location: Right Arm)   Pulse 87   Temp 97.9 F (36.6 C) (Oral)   Resp 15   SpO2 97%   Visual Acuity Right Eye Distance:   Left Eye Distance:   Bilateral Distance:  Right Eye Near:   Left Eye Near:    Bilateral Near:     Physical Exam Vitals and nursing note reviewed. Chaperone present: deferred.  Constitutional:      General: Timothy Soto is not in acute distress.    Appearance: Normal appearance. Timothy Soto is not ill-appearing, toxic-appearing or diaphoretic.  HENT:     Head: Normocephalic.  Genitourinary:    Rectum: Normal.  Musculoskeletal:        General: No tenderness.     Right lower leg: No edema.     Left lower leg: No edema.  Skin:    General: Skin is warm and dry.     Capillary Refill: Capillary refill takes less than 2 seconds.     Coloration: Skin is not jaundiced.     Findings: Erythema and rash (lichenified, plaque with multiple excoriated areas and surrounding dry skin with few fissures to gluteal cleft and medial superior buttocks only, does not extend to anus) present. No bruising.  Neurological:     General: No focal deficit present.     Mental Status: Timothy Soto is alert and oriented to person, place, and time.     Sensory: No sensory deficit.     Motor: No weakness.     Gait: Gait normal.     UC  Treatments / Results  Labs (all labs ordered are listed, but only abnormal results are displayed) Labs Reviewed - No data to display  EKG   Radiology No results found.  Procedures Procedures (including critical care time)  Medications Ordered in UC Medications - No data to display  Initial Impression / Assessment and Plan / UC Course  I have reviewed the triage vital signs and the nursing notes.  Pertinent labs & imaging results that were available during my care of the patient were reviewed by me and considered in my medical decision making (see chart for details).     Dermatitis - pts rash is very lichenified, likely due to excess scratching. This has clinical characteristics primarily of a dermatitis, however cannot completely exclude a possible fungal component. I do have concern for possible developing secondary skin infection to the distal portion near the fissures. Therefore will cover for all three components with lotrisone and topical clindamycin. Recommended f/u with dermatology should sx persist. Try to keep the skin dry.  Final Clinical Impressions(s) / UC Diagnoses   Final diagnoses:  Dermatitis due to unknown cause     Discharge Instructions      Your rash does have a fungal component, however I am concerned that you are having a dermatitis secondary to heat and sweat. Please use the topical antifungal/steroid cream twice daily for up to 2 weeks.  Do not exceed 14 days of use. Prior to applying this, use the topical clindamycin swab.  You will also use this twice daily. Should your symptoms persist despite the above treatment, we would recommend dermatology consultation.   ED Prescriptions     Medication Sig Dispense Auth. Provider   clotrimazole-betamethasone (LOTRISONE) cream Apply to affected area 2 times daily, do not exceed 14 days 45 g Samyiah Halvorsen L, PA   clindamycin (CLEOCIN T) 1 % SWAB Apply 1 Dose topically 2 (two) times daily. 60 each Bessie Livingood,  Claudia Alvizo L, PA      PDMP not reviewed this encounter.   Maretta Bees, [place] 02/01/22 2326
